# Patient Record
Sex: Female | Born: 1980 | Race: White | Hispanic: No | Marital: Single | Smoking: Never smoker
Health system: Southern US, Community
[De-identification: ages and names within clinical notes are randomized; demographics above are authoritative.]

## PROBLEM LIST (undated history)

## (undated) DIAGNOSIS — N200 Calculus of kidney: Secondary | ICD-10-CM

## (undated) DIAGNOSIS — E669 Obesity, unspecified: Secondary | ICD-10-CM

---

## 2016-04-23 ENCOUNTER — Encounter (HOSPITAL_COMMUNITY): Payer: Self-pay | Admitting: *Deleted

## 2016-04-23 ENCOUNTER — Emergency Department (HOSPITAL_COMMUNITY)
Admission: EM | Admit: 2016-04-23 | Discharge: 2016-04-23 | Disposition: A | Discharge status: Home / Self Care | Payer: BLUE CROSS/BLUE SHIELD | Attending: Emergency Medicine | Admitting: Emergency Medicine

## 2016-04-23 DIAGNOSIS — N23 Unspecified renal colic: Secondary | ICD-10-CM | POA: Diagnosis not present

## 2016-04-23 DIAGNOSIS — R109 Unspecified abdominal pain: Secondary | ICD-10-CM | POA: Diagnosis present

## 2016-04-23 HISTORY — DX: Calculus of kidney: N20.0

## 2016-04-23 HISTORY — DX: Obesity, unspecified: E66.9

## 2016-04-23 LAB — CBC
HCT: 41.8 % (ref 36.0–46.0)
Hemoglobin: 12.9 g/dL (ref 12.0–15.0)
MCH: 25.4 pg — ABNORMAL LOW (ref 26.0–34.0)
MCHC: 30.9 g/dL (ref 30.0–36.0)
MCV: 82.4 fL (ref 78.0–100.0)
PLATELETS: 335 10*3/uL (ref 150–400)
RBC: 5.07 MIL/uL (ref 3.87–5.11)
RDW: 15.1 % (ref 11.5–15.5)
WBC: 7.8 10*3/uL (ref 4.0–10.5)

## 2016-04-23 LAB — BASIC METABOLIC PANEL
Anion gap: 9 (ref 5–15)
BUN: 8 mg/dL (ref 6–20)
CALCIUM: 9.1 mg/dL (ref 8.9–10.3)
CO2: 17 mmol/L — ABNORMAL LOW (ref 22–32)
CREATININE: 0.57 mg/dL (ref 0.44–1.00)
Chloride: 110 mmol/L (ref 101–111)
Glucose, Bld: 94 mg/dL (ref 65–99)
Potassium: 4.2 mmol/L (ref 3.5–5.1)
SODIUM: 136 mmol/L (ref 135–145)

## 2016-04-23 LAB — URINE MICROSCOPIC-ADD ON

## 2016-04-23 LAB — URINALYSIS, ROUTINE W REFLEX MICROSCOPIC
Bilirubin Urine: NEGATIVE
GLUCOSE, UA: NEGATIVE mg/dL
Ketones, ur: NEGATIVE mg/dL
LEUKOCYTES UA: NEGATIVE
NITRITE: NEGATIVE
PROTEIN: NEGATIVE mg/dL
Specific Gravity, Urine: 1.02 (ref 1.005–1.030)
pH: 7.5 (ref 5.0–8.0)

## 2016-04-23 LAB — I-STAT BETA HCG BLOOD, ED (MC, WL, AP ONLY)

## 2016-04-23 MED ORDER — ONDANSETRON 4 MG PO TBDP
ORAL_TABLET | ORAL | Status: AC
  Filled 2016-04-23: qty 1

## 2016-04-23 MED ORDER — SULFAMETHOXAZOLE-TRIMETHOPRIM 800-160 MG PO TABS: 1.0000 | ORAL_TABLET | Freq: Every day | ORAL | 0 refills | Status: AC

## 2016-04-23 MED ORDER — ONDANSETRON 4 MG PO TBDP: 4.0000 mg | ORAL_TABLET | Freq: Three times a day (TID) | ORAL | 0 refills | Status: DC | PRN

## 2016-04-23 MED ORDER — HYDROMORPHONE HCL 1 MG/ML IJ SOLN
1.0000 mg | INTRAMUSCULAR | Status: DC | PRN
  Administered 2016-04-23: 1 mg via INTRAVENOUS
  Filled 2016-04-23: qty 1

## 2016-04-23 MED ORDER — ONDANSETRON HCL 4 MG/2ML IJ SOLN
4.0000 mg | Freq: Once | INTRAMUSCULAR | Status: AC
  Administered 2016-04-23: 4 mg via INTRAVENOUS
  Filled 2016-04-23: qty 2

## 2016-04-23 MED ORDER — OXYCODONE-ACETAMINOPHEN 5-325 MG PO TABS
ORAL_TABLET | ORAL | Status: AC
  Filled 2016-04-23: qty 1

## 2016-04-23 MED ORDER — ONDANSETRON 4 MG PO TBDP
4.0000 mg | ORAL_TABLET | Freq: Once | ORAL | Status: AC | PRN
  Administered 2016-04-23: 4 mg via ORAL

## 2016-04-23 MED ORDER — HYDROCODONE-ACETAMINOPHEN 5-325 MG PO TABS
1.0000 | ORAL_TABLET | Freq: Once | ORAL | Status: AC
  Administered 2016-04-23: 1 via ORAL
  Filled 2016-04-23: qty 1

## 2016-04-23 MED ORDER — SODIUM CHLORIDE 0.9 % IV BOLUS (SEPSIS)
1000.0000 mL | Freq: Once | INTRAVENOUS | Status: AC
  Administered 2016-04-23: 1000 mL via INTRAVENOUS

## 2016-04-23 MED ORDER — OXYCODONE-ACETAMINOPHEN 5-325 MG PO TABS
1.0000 | ORAL_TABLET | Freq: Once | ORAL | Status: AC
  Administered 2016-04-23: 1 via ORAL

## 2016-04-23 MED ORDER — TAMSULOSIN HCL 0.4 MG PO CAPS: 0.4000 mg | ORAL_CAPSULE | Freq: Every day | ORAL | 0 refills | Status: DC

## 2016-04-23 MED ORDER — HYDROCODONE-ACETAMINOPHEN 10-325 MG PO TABS: 1.0000 | ORAL_TABLET | Freq: Four times a day (QID) | ORAL | 0 refills | Status: DC | PRN

## 2016-04-23 MED ORDER — KETOROLAC TROMETHAMINE 30 MG/ML IJ SOLN
30.0000 mg | Freq: Once | INTRAMUSCULAR | Status: AC
  Administered 2016-04-23: 30 mg via INTRAVENOUS
  Filled 2016-04-23: qty 1

## 2016-04-23 MED ORDER — SULFAMETHOXAZOLE-TRIMETHOPRIM 800-160 MG PO TABS
1.0000 | ORAL_TABLET | Freq: Once | ORAL | Status: AC
  Administered 2016-04-23: 1 via ORAL
  Filled 2016-04-23: qty 1

## 2016-04-23 MED ORDER — TAMSULOSIN HCL 0.4 MG PO CAPS
0.4000 mg | ORAL_CAPSULE | Freq: Once | ORAL | Status: AC
  Administered 2016-04-23: 0.4 mg via ORAL
  Filled 2016-04-23: qty 1

## 2016-04-23 NOTE — Discharge Instructions (Signed)
Call your urologist to discuss your most recent CT scan.  Recheck with fever or vomiting or uncontrolled pain.

## 2016-04-23 NOTE — ED Notes (Signed)
Dr. James at bedside  

## 2016-04-23 NOTE — ED Provider Notes (Signed)
[facility] Provider Note   CSN: [id number] Arrival date & time: 04/23/16  1415  First Provider Contact:  None       History   Chief Complaint Chief Complaint  Patient presents with  . Flank Pain    HPI Kayla Cabrera is a 35 y.o. female.  She has a history of kidney stones. She follows with a urologist in [place]. She has never had to have a stone extracted. She is traveling to [place]. She is with friends. She had sudden onset severe left flank pain reminiscent of her previous kidney stones. She presents here for evaluation. No preceding dysuria no fever.  HPI  Past Medical History:  Diagnosis Date  . Kidney stones   . Obesity     There are no active problems to display for this patient.   History reviewed. No pertinent surgical history.  OB History    No data available       Home Medications    Prior to Admission medications   Medication Sig Start Date End Date Taking? Authorizing Provider  HYDROcodone-acetaminophen (NORCO) 10-325 MG tablet Take 1 tablet by mouth every 6 (six) hours as needed. 04/23/16   Rolland Porter, MD  ondansetron (ZOFRAN ODT) 4 MG disintegrating tablet Take 1 tablet (4 mg total) by mouth every 8 (eight) hours as needed for nausea. 04/23/16   Rolland Porter, MD  sulfamethoxazole-trimethoprim (BACTRIM DS,SEPTRA DS) 800-160 MG tablet Take 1 tablet by mouth daily. 04/23/16 05/07/16  Rolland Porter, MD  tamsulosin (FLOMAX) 0.4 MG CAPS capsule Take 1 capsule (0.4 mg total) by mouth daily. 04/23/16   Rolland Porter, MD    Family History History reviewed. No pertinent family history.  Social History Social History  Substance Use Topics  . Smoking status: Never Smoker  . Smokeless tobacco: Not on file  . Alcohol use Yes     Comment: occ     Allergies   Review of patient's allergies indicates no known allergies.   Review of Systems Review of Systems  Constitutional: Negative for appetite change, chills, diaphoresis, fatigue and fever.  HENT:  Negative for mouth sores, sore throat and trouble swallowing.   Eyes: Negative for visual disturbance.  Respiratory: Negative for cough, chest tightness, shortness of breath and wheezing.   Cardiovascular: Negative for chest pain.  Gastrointestinal: Negative for abdominal distention, abdominal pain, diarrhea, nausea and vomiting.  Endocrine: Negative for polydipsia, polyphagia and polyuria.  Genitourinary: Positive for dysuria and urgency. Negative for frequency and hematuria.  Musculoskeletal: Negative for gait problem.  Skin: Negative for color change, pallor and rash.  Neurological: Negative for dizziness, syncope, light-headedness and headaches.  Hematological: Does not bruise/bleed easily.  Psychiatric/Behavioral: Negative for behavioral problems and confusion.     Physical Exam Updated Vital Signs BP 140/82   Pulse 96   Temp 98.1 F (36.7 C) (Oral)   Resp 16   LMP 04/09/2016 (Approximate)   SpO2 100%   Physical Exam  Constitutional: She is oriented to person, place, and time. She appears well-developed and well-nourished. No distress.  Awake and alert. Pacing. Lower hand against her flank. She appears uncomfortable.  HENT:  Head: Normocephalic.  Eyes: Conjunctivae are normal. Pupils are equal, round, and reactive to light. No scleral icterus.  Neck: Normal range of motion. Neck supple. No thyromegaly present.  Cardiovascular: Normal rate and regular rhythm.  Exam reveals no gallop and no friction rub.   No murmur heard. Pulmonary/Chest: Effort normal and breath sounds normal. No respiratory distress. She has  no wheezes. She has no rales.  Abdominal: Soft. Bowel sounds are normal. She exhibits no distension. There is no tenderness. There is no rebound.  No reproducible tenderness in the abdomen or flank.  Musculoskeletal: Normal range of motion.  Neurological: She is alert and oriented to person, place, and time.  Skin: Skin is warm and dry. No rash noted.  Psychiatric:  She has a normal mood and affect. Her behavior is normal.     ED Treatments / Results  Labs (all labs ordered are listed, but only abnormal results are displayed) Labs Reviewed  URINALYSIS, ROUTINE W REFLEX MICROSCOPIC (NOT AT [facility]) - Abnormal; Notable for the following:       Result Value   APPearance CLOUDY (*)    Hgb urine dipstick LARGE (*)    All other components within normal limits  BASIC METABOLIC PANEL - Abnormal; Notable for the following:    CO2 17 (*)    All other components within normal limits  CBC - Abnormal; Notable for the following:    MCH 25.4 (*)    All other components within normal limits  URINE MICROSCOPIC-ADD ON - Abnormal; Notable for the following:    Squamous Epithelial / LPF 0-5 (*)    Bacteria, UA MANY (*)    All other components within normal limits  URINE CULTURE  I-STAT BETA HCG BLOOD, ED ([facility], [facility], [facility] ONLY)    EKG  EKG Interpretation None       Radiology No results found.  Procedures Procedures (including critical care time)  Medications Ordered in ED Medications  ondansetron (ZOFRAN-ODT) 4 MG disintegrating tablet (not administered)  oxyCODONE-acetaminophen (PERCOCET/ROXICET) 5-325 MG per tablet (not administered)  HYDROmorphone (DILAUDID) injection 1 mg (1 mg Intravenous Given 04/23/16 1840)  sulfamethoxazole-trimethoprim (BACTRIM DS,SEPTRA DS) 800-160 MG per tablet 1 tablet (not administered)  ondansetron (ZOFRAN-ODT) disintegrating tablet 4 mg (4 mg Oral Given 04/23/16 1503)  oxyCODONE-acetaminophen (PERCOCET/ROXICET) 5-325 MG per tablet 1 tablet (1 tablet Oral Given 04/23/16 1504)  ondansetron (ZOFRAN) injection 4 mg (4 mg Intravenous Given 04/23/16 1839)  ketorolac (TORADOL) 30 MG/ML injection 30 mg (30 mg Intravenous Given 04/23/16 1839)  tamsulosin (FLOMAX) capsule 0.4 mg (0.4 mg Oral Given 04/23/16 1847)  sodium chloride 0.9 % bolus 1,000 mL (0 mLs Intravenous Stopped 04/23/16 2059)  ondansetron (ZOFRAN) injection 4 mg (4 mg Intravenous  Given 04/23/16 2057)  HYDROcodone-acetaminophen (NORCO/VICODIN) 5-325 MG per tablet 1 tablet (1 tablet Oral Given 04/23/16 2059)     Initial Impression / Assessment and Plan / ED Course  I have reviewed the triage vital signs and the nursing notes.  Pertinent labs & imaging results that were available during my care of the patient were reviewed by me and considered in my medical decision making (see chart for details).  Clinical Course    Patient's pain was much improved and appeared relaxed and more comfortable on exam.  She has too many to count red blood cells. 0-5 wbc's. Nitrate negative but many bacteria. Culture pending. I think this is very likely ureteral colic and not as coexistent UTI. Her symptoms were sudden in onset without preceding dysuria. She follows with a urologist in [place]. She had an imaging study about 6 weeks ago. She has never had have a stone extracted in the past. Have asked her to call her urologist to discuss her most recent CT scan. If previous stones are small enough size to expect spontaneous passage I do not think this would preclude her travel. However I  will also prescribed her Bactrim to take a dose prophylactically daily. Urine culture is pending as well. She will return to emergency room with fever, vomiting, or uncontrolled pain.  Final Clinical Impressions(s) / ED Diagnoses   Final diagnoses:  Ureteral colic    New Prescriptions New Prescriptions   HYDROCODONE-ACETAMINOPHEN (NORCO) 10-325 MG TABLET    Take 1 tablet by mouth every 6 (six) hours as needed.   ONDANSETRON (ZOFRAN ODT) 4 MG DISINTEGRATING TABLET    Take 1 tablet (4 mg total) by mouth every 8 (eight) hours as needed for nausea.   SULFAMETHOXAZOLE-TRIMETHOPRIM (BACTRIM DS,SEPTRA DS) 800-160 MG TABLET    Take 1 tablet by mouth daily.   TAMSULOSIN (FLOMAX) 0.4 MG CAPS CAPSULE    Take 1 capsule (0.4 mg total) by mouth daily.     Rolland Porter, MD 04/23/16 2108

## 2016-04-23 NOTE — ED Triage Notes (Signed)
Pt reports onset two hours ago of left side flank pain that radiates around her side. Having nausea and dark colored urine. Hx of kidney stones.

## 2016-04-25 LAB — URINE CULTURE

## 2016-08-20 ENCOUNTER — Encounter (HOSPITAL_COMMUNITY): Payer: Self-pay | Admitting: *Deleted

## 2016-08-20 DIAGNOSIS — R319 Hematuria, unspecified: Secondary | ICD-10-CM | POA: Insufficient documentation

## 2016-08-20 DIAGNOSIS — R109 Unspecified abdominal pain: Secondary | ICD-10-CM | POA: Diagnosis present

## 2016-08-20 LAB — URINALYSIS, ROUTINE W REFLEX MICROSCOPIC
BILIRUBIN URINE: NEGATIVE
GLUCOSE, UA: NEGATIVE mg/dL
KETONES UR: NEGATIVE mg/dL
Leukocytes, UA: NEGATIVE
NITRITE: NEGATIVE
PH: 6 (ref 5.0–8.0)
Protein, ur: NEGATIVE mg/dL
Specific Gravity, Urine: 1.024 (ref 1.005–1.030)

## 2016-08-20 LAB — CBC
HCT: 41.6 % (ref 36.0–46.0)
Hemoglobin: 13.5 g/dL (ref 12.0–15.0)
MCH: 26.2 pg (ref 26.0–34.0)
MCHC: 32.5 g/dL (ref 30.0–36.0)
MCV: 80.8 fL (ref 78.0–100.0)
PLATELETS: 331 10*3/uL (ref 150–400)
RBC: 5.15 MIL/uL — ABNORMAL HIGH (ref 3.87–5.11)
RDW: 14.5 % (ref 11.5–15.5)
WBC: 9.5 10*3/uL (ref 4.0–10.5)

## 2016-08-20 LAB — BASIC METABOLIC PANEL
Anion gap: 7 (ref 5–15)
BUN: 9 mg/dL (ref 6–20)
CALCIUM: 9.7 mg/dL (ref 8.9–10.3)
CO2: 24 mmol/L (ref 22–32)
CREATININE: 0.76 mg/dL (ref 0.44–1.00)
Chloride: 107 mmol/L (ref 101–111)
GFR calc non Af Amer: >60 mL/min (ref >60)
GLUCOSE: 85 mg/dL (ref 65–99)
Potassium: 4 mmol/L (ref 3.5–5.1)
Sodium: 138 mmol/L (ref 135–145)

## 2016-08-20 LAB — URINE MICROSCOPIC-ADD ON

## 2016-08-20 MED ORDER — OXYCODONE-ACETAMINOPHEN 5-325 MG PO TABS
1.0000 | ORAL_TABLET | ORAL | Status: DC | PRN
Start: 2016-08-20 — End: 2016-08-21
  Administered 2016-08-20: 1 via ORAL

## 2016-08-20 MED ORDER — OXYCODONE-ACETAMINOPHEN 5-325 MG PO TABS
ORAL_TABLET | ORAL | Status: AC
  Filled 2016-08-20: qty 1

## 2016-08-20 NOTE — ED Triage Notes (Signed)
Pt c/o left sided flank pain onset today. Hx of kidney stones, pain feels the same

## 2016-08-21 ENCOUNTER — Emergency Department (HOSPITAL_COMMUNITY)
Admission: EM | Admit: 2016-08-21 | Discharge: 2016-08-21 | Disposition: A | Discharge status: Home / Self Care | Payer: BLUE CROSS/BLUE SHIELD | Attending: Emergency Medicine | Admitting: Emergency Medicine

## 2016-08-21 DIAGNOSIS — R109 Unspecified abdominal pain: Secondary | ICD-10-CM

## 2016-08-21 DIAGNOSIS — R319 Hematuria, unspecified: Secondary | ICD-10-CM

## 2016-08-21 HISTORY — DX: Calculus of kidney: N20.0

## 2016-08-21 MED ORDER — OXYCODONE-ACETAMINOPHEN 5-325 MG PO TABS: 1.0000 | ORAL_TABLET | Freq: Four times a day (QID) | ORAL | 0 refills | Status: DC | PRN

## 2016-08-21 MED ORDER — KETOROLAC TROMETHAMINE 30 MG/ML IJ SOLN
30.0000 mg | Freq: Once | INTRAMUSCULAR | Status: AC
  Administered 2016-08-21: 30 mg via INTRAVENOUS
  Filled 2016-08-21: qty 1

## 2016-08-21 MED ORDER — SODIUM CHLORIDE 0.9 % IV BOLUS (SEPSIS)
1000.0000 mL | Freq: Once | INTRAVENOUS | Status: AC
  Administered 2016-08-21: 1000 mL via INTRAVENOUS

## 2016-08-21 MED ORDER — ONDANSETRON HCL 4 MG/2ML IJ SOLN
8.0000 mg | Freq: Once | INTRAMUSCULAR | Status: AC
  Administered 2016-08-21: 8 mg via INTRAVENOUS
  Filled 2016-08-21: qty 4

## 2016-08-21 MED ORDER — TAMSULOSIN HCL 0.4 MG PO CAPS: 0.4000 mg | ORAL_CAPSULE | Freq: Every day | ORAL | 0 refills | Status: DC

## 2016-08-21 MED ORDER — ONDANSETRON 4 MG PO TBDP: 4.0000 mg | ORAL_TABLET | Freq: Four times a day (QID) | ORAL | 0 refills | Status: DC | PRN

## 2016-08-21 MED ORDER — HYDROMORPHONE HCL 2 MG/ML IJ SOLN
1.0000 mg | Freq: Once | INTRAMUSCULAR | Status: AC
  Administered 2016-08-21: 1 mg via INTRAVENOUS
  Filled 2016-08-21: qty 1

## 2016-08-21 MED ORDER — IBUPROFEN 800 MG PO TABS: 800.0000 mg | ORAL_TABLET | Freq: Three times a day (TID) | ORAL | 0 refills | Status: DC | PRN

## 2016-08-21 NOTE — ED Provider Notes (Signed)
TIME SEEN:  By signing my name below, I, Arianna Nassar, attest that this documentation has been prepared under the direction and in the presence of [employer] EnergyKristen N Kyeshia Zinn, DO.  Electronically Signed: Octavia HeirArianna Nassar, ED Scribe. 08/21/16. 1:52 AM.   CHIEF COMPLAINT:  Chief Complaint  Patient presents with  . Nephrolithiasis     HPI:  HPI Comments: Kayla Cabrera is a [age] y.o. female who has a hx of kidney stones presents to the Emergency Department complaining of sudden onset, waxing and waning, gradual worsening, left Flank pain that started ~ 3 pm this afternoon. Pt describes the pain as sharp. She reports associated mild abdominal bloating, nausea and vomiting. Pt believes she may have a kidney stone due to having similar symptoms previous hx of multiple left sided kidney stones. Pt has always passed her kidney stones without any intervention. She denies abdominal pain, fever or diarrhea.  ROS: See HPI Constitutional: no fever  Eyes: no drainage  ENT: no runny nose   Cardiovascular:  no chest pain  Resp: no SOB  GI: no vomiting GU: no dysuria Integumentary: no rash  Allergy: no hives  Musculoskeletal: no leg swelling  Neurological: no slurred speech ROS otherwise negative  PAST MEDICAL HISTORY/PAST SURGICAL HISTORY:  Past Medical History:  Diagnosis Date  . Kidney stone   . Kidney stones   . Obesity     MEDICATIONS:  Prior to Admission medications   Medication Sig Start Date End Date Taking? Authorizing Provider  HYDROcodone-acetaminophen (NORCO) 10-325 MG tablet Take 1 tablet by mouth every 6 (six) hours as needed. 04/23/16   Rolland PorterMark James, MD  ondansetron (ZOFRAN ODT) 4 MG disintegrating tablet Take 1 tablet (4 mg total) by mouth every 8 (eight) hours as needed for nausea. 04/23/16   Rolland PorterMark James, MD  tamsulosin (FLOMAX) 0.4 MG CAPS capsule Take 1 capsule (0.4 mg total) by mouth daily. 04/23/16   Rolland PorterMark James, MD    ALLERGIES:  No Known Allergies  SOCIAL HISTORY:  Social History   Substance Use Topics  . Smoking status: Never Smoker  . Smokeless tobacco: Never Used  . Alcohol use Yes     Comment: occ    FAMILY HISTORY: No family history on file.  EXAM: BP 143/85 (BP Location: Left Arm)   Pulse 94   Temp 98 F (36.7 C) (Oral)   Resp 24   LMP 08/03/2016   SpO2 100%  CONSTITUTIONAL: Alert and oriented and responds appropriately to questions. Well-appearing; well-nourished; obese; appears uncomfortable; afebrile, nontoxic appearing. HEAD: Normocephalic EYES: Conjunctivae clear, PERRL, EOMI ENT: normal nose; no rhinorrhea; moist mucous membranes NECK: Supple, no meningismus, no nuchal rigidity, no LAD  CARD: RRR; S1 and S2 appreciated; no murmurs, no clicks, no rubs, no gallops RESP: Normal chest excursion without splinting or tachypnea; breath sounds clear and equal bilaterally; no wheezes, no rhonchi, no rales, no hypoxia or respiratory distress, speaking full sentences ABD/GI: Normal bowel sounds; non-distended; soft, non-tender, no rebound, no guarding, no peritoneal signs, no hepatosplenomegaly BACK:  The back appears normal and is non-tender to palpation, there is no CVA tenderness EXT: Normal ROM in all joints; non-tender to palpation; no edema; normal capillary refill; no cyanosis, no calf tenderness or swelling    SKIN: Normal color for age and race; warm; no rash NEURO: Moves all extremities equally, sensation to light touch intact diffusely, cranial nerves II through XII intact, normal speech PSYCH: The patient's mood and manner are appropriate. Grooming and personal hygiene are appropriate.  MEDICAL DECISION  MAKING: Patient here with acute onset left flank pain. Feels similar to her prior kidney stones. Labs ordered in triage are unremarkable. Urine shows blood but no other sign of infection. I do not feel she needs imaging at this time given confirmed history of kidney stones. Will treat with Toradol, Dilaudid, IV fluids, Zofran and reassess.  2:50  AM Pt states that she feels much better and feels like a "new person". Pt is comfortable with above plan and is stable for discharge at this time. Strict return precautions for return into the ED were discussed.    At this time, I do not feel there is any life-threatening condition present. I have reviewed and discussed all results (EKG, imaging, lab, urine as appropriate) and exam findings with patient/family. I have reviewed nursing notes and appropriate previous records.  I feel the patient is safe to be discharged home without further emergent workup and can continue workup as an outpatient as needed. Discussed usual and customary return precautions. Patient/family verbalize understanding and are comfortable with this plan.  Outpatient follow-up has been provided. All questions have been answered.   I personally performed the services described in this documentation, which was scribed in my presence. The recorded information has been reviewed and is accurate.    Layla MawKristen N Mardell Cragg, DO 08/21/16 [phone]

## 2016-09-21 ENCOUNTER — Emergency Department (HOSPITAL_COMMUNITY)
Admission: EM | Admit: 2016-09-21 | Discharge: 2016-09-21 | Disposition: A | Discharge status: Home / Self Care | Payer: BLUE CROSS/BLUE SHIELD | Attending: Emergency Medicine | Admitting: Emergency Medicine

## 2016-09-21 ENCOUNTER — Encounter (HOSPITAL_COMMUNITY): Payer: Self-pay | Admitting: *Deleted

## 2016-09-21 ENCOUNTER — Emergency Department (HOSPITAL_COMMUNITY): Payer: BLUE CROSS/BLUE SHIELD

## 2016-09-21 DIAGNOSIS — R109 Unspecified abdominal pain: Secondary | ICD-10-CM | POA: Insufficient documentation

## 2016-09-21 DIAGNOSIS — Z7289 Other problems related to lifestyle: Secondary | ICD-10-CM | POA: Diagnosis not present

## 2016-09-21 DIAGNOSIS — R000 Tachycardia, unspecified: Secondary | ICD-10-CM | POA: Insufficient documentation

## 2016-09-21 DIAGNOSIS — Z765 Malingerer [conscious simulation]: Secondary | ICD-10-CM

## 2016-09-21 LAB — CBC
HCT: 40.3 % (ref 36.0–46.0)
Hemoglobin: 13 g/dL (ref 12.0–15.0)
MCH: 26 pg (ref 26.0–34.0)
MCHC: 32.3 g/dL (ref 30.0–36.0)
MCV: 80.6 fL (ref 78.0–100.0)
Platelets: 307 10*3/uL (ref 150–400)
RBC: 5 MIL/uL (ref 3.87–5.11)
RDW: 14.6 % (ref 11.5–15.5)
WBC: 11.5 10*3/uL — ABNORMAL HIGH (ref 4.0–10.5)

## 2016-09-21 LAB — COMPREHENSIVE METABOLIC PANEL
ALT: 16 U/L (ref 14–54)
ANION GAP: 10 (ref 5–15)
AST: 19 U/L (ref 15–41)
Albumin: 3.7 g/dL (ref 3.5–5.0)
Alkaline Phosphatase: 62 U/L (ref 38–126)
BUN: 7 mg/dL (ref 6–20)
CALCIUM: 9.2 mg/dL (ref 8.9–10.3)
CO2: 21 mmol/L — ABNORMAL LOW (ref 22–32)
Chloride: 106 mmol/L (ref 101–111)
Creatinine, Ser: 0.85 mg/dL (ref 0.44–1.00)
GFR calc Af Amer: >60 mL/min (ref >60)
GLUCOSE: 84 mg/dL (ref 65–99)
Potassium: 3.6 mmol/L (ref 3.5–5.1)
SODIUM: 137 mmol/L (ref 135–145)
TOTAL PROTEIN: 6.9 g/dL (ref 6.5–8.1)
Total Bilirubin: 0.4 mg/dL (ref 0.3–1.2)

## 2016-09-21 LAB — URINALYSIS, ROUTINE W REFLEX MICROSCOPIC
Bilirubin Urine: NEGATIVE
GLUCOSE, UA: NEGATIVE mg/dL
Ketones, ur: NEGATIVE mg/dL
Leukocytes, UA: NEGATIVE
NITRITE: NEGATIVE
PH: 5 (ref 5.0–8.0)
Protein, ur: NEGATIVE mg/dL
SPECIFIC GRAVITY, URINE: 1.006 (ref 1.005–1.030)

## 2016-09-21 LAB — I-STAT BETA HCG BLOOD, ED (MC, WL, AP ONLY): I-stat hCG, quantitative: <5 m[IU]/mL (ref <5)

## 2016-09-21 LAB — LIPASE, BLOOD: Lipase: 26 U/L (ref 11–51)

## 2016-09-21 NOTE — ED Triage Notes (Addendum)
Hx of kidney stones, pt having left side flank pain that radiates around to front. reports having n/v/d and fever. Appears uncomfortable at triage.

## 2016-09-21 NOTE — ED Provider Notes (Signed)
[facility] Provider Note   CSN: [id number] Arrival date & time: 09/21/16  1642     History   Chief Complaint Chief Complaint  Patient presents with  . Back Pain  . Emesis  . Fever    HPI Kayla Cabrera is a 35 y.o. female.  HPI Patient presents with left flank pain for last 2 hours. States that it started suddenly. States feels a previous kidney stones. States her urine spell little bit dark and she is worried that she has another stone. Denies fevers or me. Has had nausea and vomiting. She states she came here directly after the pain started. Reviewing records shows that she was at another hospital earlier today in [place]. Patient's denies this and states she was at a different hospital 3 days ago. She also told me she did not had any kidney stones for the last month but has had 3 ER visits for pain in the last month. [facility] she was seen and sent home with suspicion of drug-seeking behaviors and she had a negative renal ultrasound and multiple prior prescriptions. She had prescriptions filled from [facility] 3 days ago. She told me she had not had any pain within the last month. Patient states that she only took a Toradol for the pain. States she does not have any hydrocodone but she did have 12 from filled 3 days ago.Patient states she lives out of town but is moving up here.   Past Medical History:  Diagnosis Date  . Kidney stone   . Kidney stones   . Obesity     There are no active problems to display for this patient.   History reviewed. No pertinent surgical history.  OB History    No data available       Home Medications    Prior to Admission medications   Medication Sig Start Date End Date Taking? Authorizing Provider  ibuprofen (ADVIL,MOTRIN) 800 MG tablet Take 1 tablet (800 mg total) by mouth every 8 (eight) hours as needed for mild pain. 08/21/16   Kristen N Ward, DO  ondansetron (ZOFRAN ODT) 4 MG disintegrating tablet Take 1  tablet (4 mg total) by mouth every 6 (six) hours as needed for nausea or vomiting. 08/21/16   Kristen N Ward, DO  oxyCODONE-acetaminophen (PERCOCET/ROXICET) 5-325 MG tablet Take 1-2 tablets by mouth every 6 (six) hours as needed. 08/21/16   Kristen N Ward, DO  tamsulosin (FLOMAX) 0.4 MG CAPS capsule Take 1 capsule (0.4 mg total) by mouth daily. 08/21/16   Layla MawKristen N Ward, DO    Family History History reviewed. No pertinent family history.  Social History Social History  Substance Use Topics  . Smoking status: Never Smoker  . Smokeless tobacco: Never Used  . Alcohol use Yes     Comment: occ     Allergies   Patient has no known allergies.   Review of Systems Review of Systems  Constitutional: Negative for appetite change and fever.  Respiratory: Negative for cough.   Gastrointestinal: Positive for nausea and vomiting. Negative for abdominal pain.  Genitourinary: Positive for flank pain. Negative for hematuria and pelvic pain.  Musculoskeletal: Negative for back pain.  Neurological: Negative for headaches.     Physical Exam Updated Vital Signs BP (!) 129/104 (BP Location: Right Arm)   Pulse 120   Temp 98.3 F (36.8 C) (Oral)   Resp 22   LMP 09/10/2016   SpO2 100%   Physical Exam  Constitutional: She appears well-developed.  HENT:  Head: Normocephalic.  Eyes: Pupils are equal, round, and reactive to light.  Cardiovascular:  Mild tachycardia  Pulmonary/Chest: Effort normal.  Abdominal: Soft. She exhibits no distension.  Musculoskeletal:  No CVA tenderness. Patient is standing and over onto the bed.  Skin: Skin is warm.     ED Treatments / Results  Labs (all labs ordered are listed, but only abnormal results are displayed) Labs Reviewed  URINALYSIS, ROUTINE W REFLEX MICROSCOPIC - Abnormal; Notable for the following:       Result Value   Hgb urine dipstick LARGE (*)    Bacteria, UA RARE (*)    Squamous Epithelial / LPF 0-5 (*)    All other components within  normal limits  LIPASE, BLOOD  COMPREHENSIVE METABOLIC PANEL  CBC  I-STAT BETA HCG BLOOD, ED ([facility], [facility], [facility] ONLY)    EKG  EKG Interpretation None       Radiology No results found.  Procedures Procedures (including critical care time)  Medications Ordered in ED Medications - No data to display   Initial Impression / Assessment and Plan / ED Course  I have reviewed the triage vital signs and the nursing notes.  Pertinent labs & imaging results that were available during my care of the patient were reviewed by me and considered in my medical decision making (see chart for details).  Clinical Course     Patient presented with reported flank pain like previous kidney stones. Was seen at another ER earlier today which patient denied to me. She eloped from thereafter should not get narcotics. She was informed here she would not get narcotics unless the ultrasound showed hydronephrosis. She eloped from the ER here. High suspicion for drug-seeking behavior. Final Clinical Impressions(s) / ED Diagnoses   Final diagnoses:  Drug-seeking behavior    New Prescriptions New Prescriptions   No medications on file     Benjiman CoreNathan Zalyn Amend, MD 09/21/16 [id number]

## 2016-09-21 NOTE — ED Notes (Signed)
ED Provider at bedside. 

## 2016-12-10 ENCOUNTER — Inpatient Hospital Stay
Admit: 2016-12-10 | Discharge: 2016-12-10 | Disposition: A | Discharge status: Home / Self Care | Payer: BLUE CROSS/BLUE SHIELD | Attending: Emergency Medicine

## 2016-12-10 ENCOUNTER — Emergency Department: Admit: 2016-12-10 | Payer: BLUE CROSS/BLUE SHIELD | Primary: Family Medicine

## 2016-12-10 DIAGNOSIS — R109 Unspecified abdominal pain: Secondary | ICD-10-CM

## 2016-12-10 LAB — METABOLIC PANEL, COMPREHENSIVE
A-G Ratio: 0.9 — ABNORMAL LOW (ref 1.2–3.5)
ALT (SGPT): 33 U/L (ref 12–65)
AST (SGOT): 20 U/L (ref 15–37)
Albumin: 3.9 g/dL (ref 3.5–5.0)
Alk. phosphatase: 92 U/L (ref 50–136)
Anion gap: 12 mmol/L (ref 7–16)
BUN: 7 MG/DL (ref 6–23)
Bilirubin, total: 0.4 MG/DL (ref 0.2–1.1)
CO2: 23 mmol/L (ref 21–32)
Calcium: 9.2 MG/DL (ref 8.3–10.4)
Chloride: 105 mmol/L (ref 98–107)
Creatinine: 0.79 MG/DL (ref 0.6–1.0)
GFR est AA: >60 mL/min/{1.73_m2} (ref >60)
GFR est non-AA: >60 mL/min/{1.73_m2} (ref >60)
Globulin: 4.3 g/dL — ABNORMAL HIGH (ref 2.3–3.5)
Glucose: 87 mg/dL (ref 65–100)
Potassium: 4.3 mmol/L (ref 3.5–5.1)
Protein, total: 8.2 g/dL (ref 6.3–8.2)
Sodium: 140 mmol/L (ref 136–145)

## 2016-12-10 LAB — CBC WITH AUTOMATED DIFF
ABS. BASOPHILS: 0 10*3/uL (ref 0.0–0.2)
ABS. EOSINOPHILS: 0.1 10*3/uL (ref 0.0–0.8)
ABS. IMM. GRANS.: 0 10*3/uL (ref 0.0–0.5)
ABS. LYMPHOCYTES: 2 10*3/uL (ref 0.5–4.6)
ABS. MONOCYTES: 0.7 10*3/uL (ref 0.1–1.3)
ABS. NEUTROPHILS: 6.5 10*3/uL (ref 1.7–8.2)
BASOPHILS: 0 % (ref 0.0–2.0)
EOSINOPHILS: 2 % (ref 0.5–7.8)
HCT: 44.4 % (ref 35.8–46.3)
HGB: 14.5 g/dL (ref 11.7–15.4)
IMMATURE GRANULOCYTES: 0 % (ref 0.0–5.0)
LYMPHOCYTES: 21 % (ref 13–44)
MCH: 27.1 PG (ref 26.1–32.9)
MCHC: 32.7 g/dL (ref 31.4–35.0)
MCV: 83 FL (ref 79.6–97.8)
MONOCYTES: 7 % (ref 4.0–12.0)
MPV: 9.4 FL — ABNORMAL LOW (ref 10.8–14.1)
NEUTROPHILS: 70 % (ref 43–78)
PLATELET: 380 10*3/uL (ref 150–450)
RBC: 5.35 M/uL — ABNORMAL HIGH (ref 4.05–5.25)
RDW: 14.6 % (ref 11.9–14.6)
WBC: 9.4 10*3/uL (ref 4.3–11.1)

## 2016-12-10 LAB — URINE MICROSCOPIC
Bacteria: 0 /hpf
Casts: 0 /lpf
Crystals, urine: 0 /LPF
Mucus: 0 /lpf
RBC: >100 /hpf

## 2016-12-10 LAB — HCG URINE, QL. - POC: Pregnancy test,urine (POC): NEGATIVE

## 2016-12-10 MED ORDER — IBUPROFEN 800 MG TAB
800 mg | ORAL_TABLET | Freq: Three times a day (TID) | ORAL | 0 refills | Status: AC | PRN
Start: 2016-12-10 — End: 2016-12-17

## 2016-12-10 MED ORDER — TAMSULOSIN SR 0.4 MG 24 HR CAP
0.4 mg | ORAL_CAPSULE | Freq: Every day | ORAL | 0 refills | Status: AC
Start: 2016-12-10 — End: 2016-12-25

## 2016-12-10 MED ORDER — SODIUM CHLORIDE 0.9% BOLUS IV
0.9 % | Freq: Once | INTRAVENOUS | Status: AC
Start: 2016-12-10 — End: 2016-12-10
  Administered 2016-12-10: 21:00:00 via INTRAVENOUS

## 2016-12-10 MED ORDER — ONDANSETRON 8 MG TAB, RAPID DISSOLVE
8 mg | ORAL_TABLET | Freq: Three times a day (TID) | ORAL | 1 refills | Status: AC | PRN
Start: 2016-12-10 — End: ?

## 2016-12-10 MED ORDER — HYDROCODONE-ACETAMINOPHEN 5 MG-325 MG TAB
5-325 mg | ORAL_TABLET | Freq: Four times a day (QID) | ORAL | 0 refills | Status: AC | PRN
Start: 2016-12-10 — End: ?

## 2016-12-10 MED ORDER — ONDANSETRON (PF) 4 MG/2 ML INJECTION
4 mg/2 mL | INTRAMUSCULAR | Status: AC
Start: 2016-12-10 — End: 2016-12-10
  Administered 2016-12-10: 21:00:00 via INTRAVENOUS

## 2016-12-10 MED ORDER — HYDROMORPHONE 0.5 MG/ML IN 0.9 % NACL IV SYRINGE
0.5 mg/mL | INTRAVENOUS | Status: AC
Start: 2016-12-10 — End: 2016-12-10
  Administered 2016-12-10: 21:00:00 via INTRAVENOUS

## 2016-12-10 MED FILL — ONDANSETRON (PF) 4 MG/2 ML INJECTION: 4 mg/2 mL | INTRAMUSCULAR | Qty: 2

## 2016-12-10 MED FILL — HYDROMORPHONE 0.5 MG/ML IN 0.9 % NACL IV SYRINGE: 0.5 mg/mL | INTRAVENOUS | Qty: 2

## 2016-12-10 NOTE — ED Notes (Signed)
Pt not at bedside

## 2016-12-10 NOTE — ED Notes (Signed)
I have reviewed discharge instructions with the patient.  The patient verbalized understanding.    Patient left ED via Discharge Method: ambulatory to Home with self    Opportunity for questions and clarification provided.       Patient given 3 scripts.         To continue your aftercare when you leave the hospital, you may receive an automated call from our care team to check in on how you are doing.  This is a free service and part of our promise to provide the best care and service to meet your aftercare needs.??? If you have questions, or wish to unsubscribe from this service please call [phone].  Thank you for Choosing our [facility].

## 2016-12-10 NOTE — ED Triage Notes (Signed)
Patient presents to emergency room with complaints of left flank pain, diarrhea, and hematuria since yesterday.  Patient denies any fever, chills, nausea, or vomiting. Patient states history of kidney stones with similar pain.

## 2016-12-10 NOTE — ED Provider Notes (Signed)
HPI Comments: 36 year old white female with history of kidney stones last one passed 08/2016 presents with left flank pain which began gradually yesterday and severe few hours ago.  Has had some hematuria and frequency.  No vomiting, fever.    Patient is a 36 y.o. female presenting with flank pain. The history is provided by the patient.   Flank Pain    Pertinent negatives include no fever, no headaches, no abdominal pain and no dysuria.        Past Medical History:   Diagnosis Date   ??? Kidney calculi        No past surgical history on file.      No family history on file.    Social History     Social History   ??? Marital status: SINGLE     Spouse name: N/A   ??? Number of children: N/A   ??? Years of education: N/A     Occupational History   ??? Not on file.     Social History Main Topics   ??? Smoking status: Never Smoker   ??? Smokeless tobacco: Never Used   ??? Alcohol use No   ??? Drug use: No   ??? Sexual activity: Not on file     Other Topics Concern   ??? Not on file     Social History Narrative   ??? No narrative on file         ALLERGIES: Review of patient's allergies indicates no known allergies.    Review of Systems   Constitutional: Negative for fever.   HENT: Negative for congestion.    Respiratory: Negative for cough and shortness of breath.    Gastrointestinal: Negative for abdominal pain, nausea and vomiting.   Genitourinary: Positive for flank pain and hematuria. Negative for dysuria.   Musculoskeletal: Negative for back pain and neck pain.   Neurological: Negative for headaches.       Vitals:    12/10/16 1601   BP: (!) 174/106   Pulse: (!) 114   Resp: 18   Temp: 98.2 ??F (36.8 ??C)   SpO2: 99%   Weight: 108.9 kg (240 lb)   Height: 5\' 9"  (1.753 m)            Physical Exam   Constitutional: She is oriented to person, place, and time. She appears well-developed and well-nourished. She appears distressed.   HENT:   Head: Normocephalic and atraumatic.   Mouth/Throat: Oropharynx is clear and moist.    Eyes: Conjunctivae are normal. Pupils are equal, round, and reactive to light.   Neck: Normal range of motion. Neck supple.   Cardiovascular: Normal rate and regular rhythm.    No murmur heard.  Pulmonary/Chest: Effort normal and breath sounds normal.   Abdominal: Soft. She exhibits no distension. There is no tenderness.   Musculoskeletal: Normal range of motion. She exhibits no edema.   Neurological: She is alert and oriented to person, place, and time.   Skin: Skin is warm and dry.   Psychiatric: She has a normal mood and affect.   Nursing note and vitals reviewed.       MDM  Number of Diagnoses or Management Options  Bilateral kidney stones:   Flank pain:   Hematuria, unspecified type:   Diagnosis management comments: Blood work is unremarkable.  Urinalysis has blood but no infection.  Pregnancy is negative.  Patient treated IV fluids, IV Dilaudid and Zofran.  CT scan pending to determine if this is related to kidney  stone. Patient is afebrile and nontoxic.  Anticipate she will be safe for discharge home with outpatient management of kidney stone.       Amount and/or Complexity of Data Reviewed  Clinical lab tests: ordered and reviewed  Tests in the radiology section of CPT??: ordered  Tests in the medicine section of CPT??: ordered and reviewed    Risk of Complications, Morbidity, and/or Mortality  Presenting problems: moderate  Diagnostic procedures: moderate  Management options: moderate          ED Course       Procedures

## 2016-12-10 NOTE — ED Notes (Signed)
Medical decision making note:  Urinalysis reveals greater than 100 red cells per high-power field  CT urogram reveals small bilateral intrarenal stones, but no hydroureter, or hydronephrosis.  She may have passed a small stone spontaneously, or may have a small stone hiding between slices on the scan.  Over the significant pathology seen.  Old discharge home with strainer, urological follow-up, and when necessary medicines for renal colic.    Patient originally seen by Dr. Teofilo PodKaczmarek, who should be completing the history and physical soon.  This concludes the "medical decision making note" part of this emergency department visit note.

## 2016-12-10 NOTE — Progress Notes (Signed)
Awaiting results of pregnancy test. Please call CT at [phone] when ready.

## 2017-11-29 ENCOUNTER — Inpatient Hospital Stay
Admit: 2017-11-29 | Discharge: 2017-11-29 | Disposition: A | Discharge status: Home / Self Care | Payer: BLUE CROSS/BLUE SHIELD | Attending: Emergency Medicine

## 2017-11-29 ENCOUNTER — Emergency Department: Admit: 2017-11-29 | Payer: BLUE CROSS/BLUE SHIELD | Primary: Family Medicine

## 2017-11-29 DIAGNOSIS — R109 Unspecified abdominal pain: Secondary | ICD-10-CM

## 2017-11-29 LAB — CBC WITH AUTOMATED DIFF
ABS. BASOPHILS: 0.1 10*3/uL (ref 0.0–0.2)
ABS. EOSINOPHILS: 0.2 10*3/uL (ref 0.0–0.8)
ABS. IMM. GRANS.: 0.1 10*3/uL (ref 0.0–0.5)
ABS. LYMPHOCYTES: 2.1 10*3/uL (ref 0.5–4.6)
ABS. MONOCYTES: 0.8 10*3/uL (ref 0.1–1.3)
ABS. NEUTROPHILS: 6.3 10*3/uL (ref 1.7–8.2)
ABSOLUTE NRBC: 0 10*3/uL (ref 0.0–0.2)
BASOPHILS: 1 % (ref 0.0–2.0)
EOSINOPHILS: 2 % (ref 0.5–7.8)
HCT: 46.9 % — ABNORMAL HIGH (ref 35.8–46.3)
HGB: 15 g/dL (ref 11.7–15.4)
IMMATURE GRANULOCYTES: 1 % (ref 0.0–5.0)
LYMPHOCYTES: 22 % (ref 13–44)
MCH: 26.3 PG (ref 26.1–32.9)
MCHC: 32 g/dL (ref 31.4–35.0)
MCV: 82.3 FL (ref 79.6–97.8)
MONOCYTES: 8 % (ref 4.0–12.0)
MPV: 9.4 FL (ref 9.4–12.3)
NEUTROPHILS: 66 % (ref 43–78)
PLATELET: 342 10*3/uL (ref 150–450)
RBC: 5.7 M/uL — ABNORMAL HIGH (ref 4.05–5.2)
RDW: 14.3 % (ref 11.9–14.6)
WBC: 9.5 10*3/uL (ref 4.3–11.1)

## 2017-11-29 LAB — DRUG SCREEN, URINE
AMPHETAMINES: NEGATIVE
BARBITURATES: NEGATIVE
BENZODIAZEPINES: NEGATIVE
COCAINE: NEGATIVE
METHADONE: NEGATIVE
OPIATES: NEGATIVE
PCP(PHENCYCLIDINE): NEGATIVE
THC (TH-CANNABINOL): NEGATIVE

## 2017-11-29 LAB — METABOLIC PANEL, COMPREHENSIVE
A-G Ratio: 0.9 — ABNORMAL LOW (ref 1.2–3.5)
ALT (SGPT): 15 U/L (ref 12–65)
AST (SGOT): 10 U/L — ABNORMAL LOW (ref 15–37)
Albumin: 3.5 g/dL (ref 3.5–5.0)
Alk. phosphatase: 84 U/L (ref 50–136)
Anion gap: 10 mmol/L (ref 7–16)
BUN: 9 MG/DL (ref 6–23)
Bilirubin, total: 0.5 MG/DL (ref 0.2–1.1)
CO2: 22 mmol/L (ref 21–32)
Calcium: 8.8 MG/DL (ref 8.3–10.4)
Chloride: 108 mmol/L — ABNORMAL HIGH (ref 98–107)
Creatinine: 0.72 MG/DL (ref 0.6–1.0)
GFR est AA: >60 mL/min/{1.73_m2} (ref >60)
GFR est non-AA: >60 mL/min/{1.73_m2} (ref >60)
Globulin: 3.9 g/dL — ABNORMAL HIGH (ref 2.3–3.5)
Glucose: 88 mg/dL (ref 65–100)
Potassium: 3.7 mmol/L (ref 3.5–5.1)
Protein, total: 7.4 g/dL (ref 6.3–8.2)
Sodium: 140 mmol/L (ref 136–145)

## 2017-11-29 LAB — HCG URINE, QL. - POC: Pregnancy test,urine (POC): NEGATIVE

## 2017-11-29 MED ORDER — NAPROXEN 500 MG TAB
500 mg | ORAL_TABLET | Freq: Two times a day (BID) | ORAL | 0 refills | Status: AC
Start: 2017-11-29 — End: 2017-12-09

## 2017-11-29 MED ORDER — KETOROLAC TROMETHAMINE 30 MG/ML INJECTION
30 mg/mL (1 mL) | INTRAMUSCULAR | Status: AC
Start: 2017-11-29 — End: 2017-11-29
  Administered 2017-11-29: 21:00:00 via INTRAVENOUS

## 2017-11-29 MED FILL — KETOROLAC TROMETHAMINE 30 MG/ML INJECTION: 30 mg/mL (1 mL) | INTRAMUSCULAR | Qty: 1

## 2017-11-29 NOTE — ED Notes (Signed)
I have reviewed discharge instructions with the patient.  The patient verbalized understanding.    Patient left ED via Discharge Method: ambulatory to Home with self.    Opportunity for questions and clarification provided.       Patient given 1 scripts.         To continue your aftercare when you leave the hospital, you may receive an automated call from our care team to check in on how you are doing.  This is a free service and part of our promise to provide the best care and service to meet your aftercare needs.??? If you have questions, or wish to unsubscribe from this service please call [phone].  Thank you for Choosing our [facility].

## 2017-11-29 NOTE — ED Provider Notes (Signed)
She is here doing photography at a wedding.  She normally lives in [place].  He presents with left flank pain and some hematuria.  Denies any trauma.  She has had no fever, chills, or infectious changes.  She has had multiple events like this in the past and old records confirm this.  States that she has kidney stones, but most studies just show intrarenal stones with no obstructing stones.  With this.  She has no fever.  She has no other abnormal bleeding.  He denies any dysuria or frequency.  She is on no blood thinners.      The history is provided by the patient.   Flank Pain    This is a new problem. The current episode started 6 to 12 hours ago. The problem has not changed since onset.The problem occurs constantly. The pain is present in the left side and lower back. The pain is moderate. Pertinent negatives include no chest pain and no fever. She has tried nothing for the symptoms.        Past Medical History:   Diagnosis Date   ??? Kidney calculi        History reviewed. No pertinent surgical history.      History reviewed. No pertinent family history.    Social History     Socioeconomic History   ??? Marital status: SINGLE     Spouse name: Not on file   ??? Number of children: Not on file   ??? Years of education: Not on file   ??? Highest education level: Not on file   Social Needs   ??? Financial resource strain: Not on file   ??? Food insecurity - worry: Not on file   ??? Food insecurity - inability: Not on file   ??? Transportation needs - medical: Not on file   ??? Transportation needs - non-medical: Not on file   Occupational History   ??? Not on file   Tobacco Use   ??? Smoking status: Never Smoker   ??? Smokeless tobacco: Never Used   Substance and Sexual Activity   ??? Alcohol use: No   ??? Drug use: No   ??? Sexual activity: Not on file   Other Topics Concern   ??? Not on file   Social History Narrative   ??? Not on file         ALLERGIES: Patient has no known allergies.    Review of Systems    Constitutional: Negative for chills and fever.   Respiratory: Negative.    Cardiovascular: Negative for chest pain.   Genitourinary: Positive for flank pain and hematuria.   All other systems reviewed and are negative.      Vitals:    11/29/17 1314 11/29/17 1740   BP: (!) 159/96 128/87   Pulse:  96   Resp: 16 18   Temp: 98.3 ??F (36.8 ??C) 98.6 ??F (37 ??C)   SpO2: 100% 99%   Weight: 117.9 kg (260 lb)    Height: 5\' 9"  (1.753 m)             Physical Exam   Constitutional: She appears well-developed and well-nourished. No distress.   Not overtly restless   HENT:   Head: Atraumatic.   Eyes: No scleral icterus.   Neck: Neck supple.   Cardiovascular: Normal rate.   Pulmonary/Chest: Effort normal. No respiratory distress.   Abdominal: Soft. There is no tenderness. There is no rebound.   Musculoskeletal: Normal range of motion.  Neurological: She is alert.   Skin: Skin is warm and dry.   Psychiatric: Thought content normal.   Nursing note and vitals reviewed.       MDM  Number of Diagnoses or Management Options  Hematuria, unspecified type:   Left flank pain:   Diagnosis management comments: With some left flank pain.  She has hematuria with this.  No fever or infectious changes.  Do not feel so this is an upper tract infection.  Does have history of that.  She states of stones, but most prior.  Review of chart showed that stones are intrarenal.  We'll have patient [place] in [place] at this point with no fremitus.  Do not feel as though narcotic-based pain meds are required       Amount and/or Complexity of Data Reviewed  Clinical lab tests: reviewed and ordered  Tests in the radiology section of CPT??: reviewed  Independent visualization of images, tracings, or specimens: yes    Patient Progress  Patient progress: stable         Procedures

## 2017-11-29 NOTE — ED Triage Notes (Signed)
Pt ambulatory to triage without complications. Pt reports hx of kidney stones and presents today with c/o left flank pain radiating to the left groin and dark, blood tinged uirne. Pt reports nausea, but denies vomiting or diarrhea, fevers, or burning upon urination.

## 2017-12-24 ENCOUNTER — Emergency Department (HOSPITAL_COMMUNITY)
Admission: EM | Admit: 2017-12-24 | Discharge: 2017-12-25 | Disposition: A | Discharge status: Home / Self Care | Payer: BLUE CROSS/BLUE SHIELD | Attending: Emergency Medicine | Admitting: Emergency Medicine

## 2017-12-24 ENCOUNTER — Encounter (HOSPITAL_COMMUNITY): Payer: Self-pay

## 2017-12-24 DIAGNOSIS — Y92410 Unspecified street and highway as the place of occurrence of the external cause: Secondary | ICD-10-CM | POA: Diagnosis not present

## 2017-12-24 DIAGNOSIS — Y939 Activity, unspecified: Secondary | ICD-10-CM | POA: Insufficient documentation

## 2017-12-24 DIAGNOSIS — Y998 Other external cause status: Secondary | ICD-10-CM | POA: Insufficient documentation

## 2017-12-24 DIAGNOSIS — M791 Myalgia, unspecified site: Secondary | ICD-10-CM | POA: Diagnosis not present

## 2017-12-24 DIAGNOSIS — R51 Headache: Secondary | ICD-10-CM | POA: Insufficient documentation

## 2017-12-24 DIAGNOSIS — S199XXA Unspecified injury of neck, initial encounter: Secondary | ICD-10-CM | POA: Diagnosis present

## 2017-12-24 DIAGNOSIS — S161XXA Strain of muscle, fascia and tendon at neck level, initial encounter: Secondary | ICD-10-CM | POA: Insufficient documentation
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
## 2017-12-25 ENCOUNTER — Emergency Department (HOSPITAL_COMMUNITY): Payer: BLUE CROSS/BLUE SHIELD

## 2017-12-25 ENCOUNTER — Encounter (HOSPITAL_COMMUNITY): Payer: Self-pay

## 2017-12-25 LAB — POC URINE PREG, ED: PREG TEST UR: NEGATIVE

## 2017-12-25 MED ORDER — METHOCARBAMOL 500 MG PO TABS: 500.0000 mg | ORAL_TABLET | Freq: Two times a day (BID) | ORAL | 0 refills | Status: DC | PRN

## 2017-12-25 MED ORDER — ONDANSETRON 4 MG PO TBDP
4.0000 mg | ORAL_TABLET | Freq: Once | ORAL | Status: AC
  Administered 2017-12-25: 4 mg via ORAL
  Filled 2017-12-25: qty 1

## 2017-12-25 MED ORDER — NAPROXEN 500 MG PO TABS: 500.0000 mg | ORAL_TABLET | Freq: Two times a day (BID) | ORAL | 0 refills | Status: DC | PRN

## 2017-12-25 MED ORDER — ACETAMINOPHEN 500 MG PO TABS
1000.0000 mg | ORAL_TABLET | Freq: Once | ORAL | Status: AC
  Administered 2017-12-25: 1000 mg via ORAL
  Filled 2017-12-25: qty 2

## 2017-12-25 MED ORDER — METHOCARBAMOL 500 MG PO TABS
500.0000 mg | ORAL_TABLET | Freq: Once | ORAL | Status: AC
  Administered 2017-12-25: 500 mg via ORAL
  Filled 2017-12-25: qty 1
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
## 2017-12-25 NOTE — ED Notes (Signed)
Patient made aware urine sample is needed. 

## 2018-08-16 ENCOUNTER — Emergency Department (HOSPITAL_COMMUNITY): Payer: BLUE CROSS/BLUE SHIELD

## 2018-08-16 ENCOUNTER — Emergency Department (HOSPITAL_COMMUNITY)
Admission: EM | Admit: 2018-08-16 | Discharge: 2018-08-17 | Disposition: A | Discharge status: Home / Self Care | Payer: BLUE CROSS/BLUE SHIELD | Attending: Emergency Medicine | Admitting: Emergency Medicine

## 2018-08-16 ENCOUNTER — Encounter (HOSPITAL_COMMUNITY): Payer: Self-pay | Admitting: Emergency Medicine

## 2018-08-16 DIAGNOSIS — R1032 Left lower quadrant pain: Secondary | ICD-10-CM

## 2018-08-16 DIAGNOSIS — R112 Nausea with vomiting, unspecified: Secondary | ICD-10-CM | POA: Diagnosis not present

## 2018-08-16 LAB — URINALYSIS, ROUTINE W REFLEX MICROSCOPIC
BILIRUBIN URINE: NEGATIVE
Glucose, UA: NEGATIVE mg/dL
KETONES UR: NEGATIVE mg/dL
LEUKOCYTES UA: NEGATIVE
NITRITE: NEGATIVE
Protein, ur: NEGATIVE mg/dL
RBC / HPF: >50 RBC/hpf — ABNORMAL HIGH (ref 0–5)
SPECIFIC GRAVITY, URINE: 1.023 (ref 1.005–1.030)
pH: 6 (ref 5.0–8.0)

## 2018-08-16 LAB — CBC
HEMATOCRIT: 43 % (ref 36.0–46.0)
HEMOGLOBIN: 13.7 g/dL (ref 12.0–15.0)
MCH: 27.1 pg (ref 26.0–34.0)
MCHC: 31.9 g/dL (ref 30.0–36.0)
MCV: 85.1 fL (ref 80.0–100.0)
NRBC: 0 % (ref 0.0–0.2)
Platelets: 393 10*3/uL (ref 150–400)
RBC: 5.05 MIL/uL (ref 3.87–5.11)
RDW: 14.2 % (ref 11.5–15.5)
WBC: 8.2 10*3/uL (ref 4.0–10.5)

## 2018-08-16 LAB — COMPREHENSIVE METABOLIC PANEL
ALT: 15 U/L (ref 0–44)
AST: 18 U/L (ref 15–41)
Albumin: 4 g/dL (ref 3.5–5.0)
Alkaline Phosphatase: 70 U/L (ref 38–126)
Anion gap: 9 (ref 5–15)
BUN: 12 mg/dL (ref 6–20)
CHLORIDE: 106 mmol/L (ref 98–111)
CO2: 24 mmol/L (ref 22–32)
CREATININE: 0.95 mg/dL (ref 0.44–1.00)
Calcium: 9.5 mg/dL (ref 8.9–10.3)
GFR calc Af Amer: >60 mL/min (ref >60)
GFR calc non Af Amer: >60 mL/min (ref >60)
Glucose, Bld: 121 mg/dL — ABNORMAL HIGH (ref 70–99)
POTASSIUM: 4.4 mmol/L (ref 3.5–5.1)
SODIUM: 139 mmol/L (ref 135–145)
Total Bilirubin: 0.4 mg/dL (ref 0.3–1.2)
Total Protein: 7.2 g/dL (ref 6.5–8.1)

## 2018-08-16 LAB — I-STAT BETA HCG BLOOD, ED (MC, WL, AP ONLY)

## 2018-08-16 LAB — LIPASE, BLOOD: LIPASE: 42 U/L (ref 11–51)

## 2018-08-16 MED ORDER — ONDANSETRON 4 MG PO TBDP: 4.0000 mg | ORAL_TABLET | Freq: Three times a day (TID) | ORAL | 0 refills | Status: DC | PRN

## 2018-08-16 MED ORDER — MORPHINE SULFATE (PF) 4 MG/ML IV SOLN
4.0000 mg | Freq: Once | INTRAVENOUS | Status: AC
  Administered 2018-08-16: 4 mg via INTRAVENOUS
  Filled 2018-08-16: qty 1

## 2018-08-16 MED ORDER — KETOROLAC TROMETHAMINE 30 MG/ML IJ SOLN
30.0000 mg | Freq: Once | INTRAMUSCULAR | Status: AC
  Administered 2018-08-16: 30 mg via INTRAVENOUS
  Filled 2018-08-16: qty 1

## 2018-08-16 MED ORDER — SODIUM CHLORIDE 0.9 % IV BOLUS
1000.0000 mL | Freq: Once | INTRAVENOUS | Status: AC
Start: 2018-08-16 — End: 2018-08-16
  Administered 2018-08-16: 1000 mL via INTRAVENOUS

## 2018-08-16 MED ORDER — ONDANSETRON HCL 4 MG/2ML IJ SOLN
4.0000 mg | Freq: Once | INTRAMUSCULAR | Status: AC
  Administered 2018-08-16: 4 mg via INTRAVENOUS
  Filled 2018-08-16: qty 2

## 2018-08-16 MED ORDER — IBUPROFEN 800 MG PO TABS: 800.0000 mg | ORAL_TABLET | Freq: Three times a day (TID) | ORAL | 0 refills | Status: DC | PRN

## 2018-08-16 MED ORDER — ONDANSETRON HCL 4 MG/2ML IJ SOLN
4.0000 mg | Freq: Once | INTRAMUSCULAR | Status: AC
  Administered 2018-08-16: 4 mg via INTRAVENOUS

## 2018-08-16 MED ORDER — OXYCODONE-ACETAMINOPHEN 5-325 MG PO TABS: 1.0000 | ORAL_TABLET | Freq: Four times a day (QID) | ORAL | 0 refills | Status: DC | PRN

## 2018-08-16 MED ORDER — ONDANSETRON HCL 4 MG/2ML IJ SOLN
INTRAMUSCULAR | Status: AC
  Filled 2018-08-16: qty 2

## 2018-08-16 NOTE — Discharge Instructions (Signed)

## 2018-08-16 NOTE — ED Provider Notes (Signed)
Emergency Department Provider Note   I have reviewed the triage vital signs and the nursing notes.   HISTORY  Chief Complaint Abdominal Pain   HPI Kayla Cabrera is a 37 y.o. female with PMH of kidney stones presents to the emergency department for evaluation of acute onset, sharp left lower abdominal pain.  Patient was driving down the road when symptoms began suddenly.  She had nausea and vomiting with continued lower abdominal discomfort.  No UTI symptoms.  No vaginal bleeding or discharge.  She did recently finish her menstrual cycle.  She was seen in the emergency department at [facility] earlier this month with similar pain.  States this does not feel like kidney stone discomfort that she has had in the past.  Radiates slightly to the flank but mostly is left of suprapubic.  No modifying factors.   Past Medical History:  Diagnosis Date  . Kidney stone   . Kidney stones   . Obesity     There are no active problems to display for this patient.   History reviewed. No pertinent surgical history.  Allergies Patient has no known allergies.  History reviewed. No pertinent family history.  Social History Social History   Tobacco Use  . Smoking status: Never Smoker  . Smokeless tobacco: Never Used  Substance Use Topics  . Alcohol use: Yes    Comment: occ  . Drug use: No    Review of Systems  Constitutional: No fever/chills Eyes: No visual changes. ENT: No sore throat. Cardiovascular: Denies chest pain. Respiratory: Denies shortness of breath. Gastrointestinal: Positive LLQ abdominal pain. Positive nausea and vomiting.  No diarrhea.  No constipation. Genitourinary: Negative for dysuria. Musculoskeletal: Negative for back pain. Skin: Negative for rash. Neurological: Negative for headaches, focal weakness or numbness.  10-point ROS otherwise negative.  ____________________________________________   PHYSICAL EXAM:  VITAL SIGNS: ED Triage Vitals  Enc Vitals  Group     BP 08/16/18 1557 (!) 161/135     Pulse Rate 08/16/18 1557 (!) 133     Resp 08/16/18 1557 (!) 22     Temp 08/16/18 1557 98.4 F (36.9 C)     Temp Source 08/16/18 1557 Oral     SpO2 08/16/18 1557 100 %     Weight 08/16/18 1556 300 lb (136.1 kg)     Height 08/16/18 1556 5\' 9"  (1.753 m)     Pain Score 08/16/18 1556 7   Constitutional: Alert and oriented. Sitting up in bed and appears uncomfortable.  Eyes: Conjunctivae are normal.  Head: Atraumatic. Nose: No congestion/rhinnorhea. Mouth/Throat: Mucous membranes are moist.  Oropharynx non-erythematous. Neck: No stridor.   Cardiovascular: Tachycardia. Good peripheral circulation. Grossly normal heart sounds.   Respiratory: Normal respiratory effort.  No retractions. Lungs CTAB. Gastrointestinal: Soft with mild LLQ abdominal pain. No rebound or guarding. No distention.  Musculoskeletal: No lower extremity tenderness nor edema. No gross deformities of extremities. Neurologic:  Normal speech and language. No gross focal neurologic deficits are appreciated.  Skin:  Skin is warm, dry and intact. No rash noted.  ____________________________________________   LABS (all labs ordered are listed, but only abnormal results are displayed)  Labs Reviewed  COMPREHENSIVE METABOLIC PANEL - Abnormal; Notable for the following components:      Result Value   Glucose, Bld 121 (*)    All other components within normal limits  URINALYSIS, ROUTINE W REFLEX MICROSCOPIC - Abnormal; Notable for the following components:   APPearance HAZY (*)    Hgb urine dipstick  LARGE (*)    RBC / HPF >50 (*)    Bacteria, UA RARE (*)    All other components within normal limits  LIPASE, BLOOD  CBC  I-STAT BETA HCG BLOOD, ED ([facility], [facility], [facility] ONLY)   ____________________________________________  RADIOLOGY  Dg Chest 2 View  Result Date: 08/16/2018 CLINICAL DATA:  Onset of sharp lower abdominal and pelvic pain while driving, had a pole over, history of kidney  stones and ovarian cyst EXAM: CHEST - 2 VIEW COMPARISON:  None FINDINGS: Normal heart size, mediastinal contours, and pulmonary vascularity. Lungs clear. No pleural effusion or pneumothorax. Bones unremarkable. IMPRESSION: Normal exam. Electronically Signed   By: Ulyses Southward M.D.   On: 08/16/2018 18:33   US Transvaginal Non-ob  Result Date: 08/16/2018 CLINICAL DATA:  Initial evaluation for acute left lower quadrant abdominal pain. EXAM: TRANSABDOMINAL AND TRANSVAGINAL ULTRASOUND OF PELVIS DOPPLER ULTRASOUND OF OVARIES TECHNIQUE: Both transabdominal and transvaginal ultrasound examinations of the pelvis were performed. Transabdominal technique was performed for global imaging of the pelvis including uterus, ovaries, adnexal regions, and pelvic cul-de-sac. It was necessary to proceed with endovaginal exam following the transabdominal exam to visualize the uterus, endometrium, and ovaries. Color and duplex Doppler ultrasound was utilized to evaluate blood flow to the ovaries. COMPARISON:  Prior CT from 08/28/2016 FINDINGS: Uterus Measurements: 7.2 x 3.7 x 4.8 cm. No fibroids or other mass visualized. Endometrium Thickness: 3 mm. Focal echogenic lesion measuring 1.1 x 0.6 x 0.9 cm with suggestion of vascular skull stalk, suggestive of a small polyp. Right ovary Measurements: 2.9 x 2.6 x 1.9 cm. Normal appearance/no adnexal mass. Left ovary Measurements: 2.8 x 1.6 x 1.6 cm. Normal appearance/no adnexal mass. Pulsed Doppler evaluation of the right ovary demonstrates normal low-resistance arterial and venous waveforms. Doppler interrogation of the left ovary was unable to be performed due to patient discomfort. While Doppler was not able to be performed, the left ovary itself is normal in appearance on grayscale imaging with no secondary signs to suggest torsion. Other findings Trace free physiologic fluid within the pelvis. IMPRESSION: 1. 1.1 cm echogenic lesion within the endometrial cavity with suggestion of  associated vascular stalk, most suggestive of a possible endometrial polyp. Consider follow-up examination with nonemergent sonohysterogram for further evaluation, prior to hysteroscopy or endometrial biopsy. 2. No other acute abnormality within the pelvis. Please note that Doppler interrogation of the left ovary was unable to be performed due to patient discomfort. Although Doppler was not performed, the left ovary itself is normal in appearance on grayscale imaging with no secondary signs to suggest torsion. If there is high clinical suspicion for possible occult torsion, a re-attempt at Doppler interrogation could be made as clinically warranted and as the patient can tolerate. Right ovary is normal in appearance without evidence for torsion. 3. Otherwise unremarkable and normal pelvic ultrasound. Electronically Signed   By: Rise Mu M.D.   On: 08/16/2018 19:53   US Pelvis Complete  Result Date: 08/16/2018 CLINICAL DATA:  Initial evaluation for acute left lower quadrant abdominal pain. EXAM: TRANSABDOMINAL AND TRANSVAGINAL ULTRASOUND OF PELVIS DOPPLER ULTRASOUND OF OVARIES TECHNIQUE: Both transabdominal and transvaginal ultrasound examinations of the pelvis were performed. Transabdominal technique was performed for global imaging of the pelvis including uterus, ovaries, adnexal regions, and pelvic cul-de-sac. It was necessary to proceed with endovaginal exam following the transabdominal exam to visualize the uterus, endometrium, and ovaries. Color and duplex Doppler ultrasound was utilized to evaluate blood flow to the ovaries. COMPARISON:  Prior CT from  08/28/2016 FINDINGS: Uterus Measurements: 7.2 x 3.7 x 4.8 cm. No fibroids or other mass visualized. Endometrium Thickness: 3 mm. Focal echogenic lesion measuring 1.1 x 0.6 x 0.9 cm with suggestion of vascular skull stalk, suggestive of a small polyp. Right ovary Measurements: 2.9 x 2.6 x 1.9 cm. Normal appearance/no adnexal mass. Left ovary  Measurements: 2.8 x 1.6 x 1.6 cm. Normal appearance/no adnexal mass. Pulsed Doppler evaluation of the right ovary demonstrates normal low-resistance arterial and venous waveforms. Doppler interrogation of the left ovary was unable to be performed due to patient discomfort. While Doppler was not able to be performed, the left ovary itself is normal in appearance on grayscale imaging with no secondary signs to suggest torsion. Other findings Trace free physiologic fluid within the pelvis. IMPRESSION: 1. 1.1 cm echogenic lesion within the endometrial cavity with suggestion of associated vascular stalk, most suggestive of a possible endometrial polyp. Consider follow-up examination with nonemergent sonohysterogram for further evaluation, prior to hysteroscopy or endometrial biopsy. 2. No other acute abnormality within the pelvis. Please note that Doppler interrogation of the left ovary was unable to be performed due to patient discomfort. Although Doppler was not performed, the left ovary itself is normal in appearance on grayscale imaging with no secondary signs to suggest torsion. If there is high clinical suspicion for possible occult torsion, a re-attempt at Doppler interrogation could be made as clinically warranted and as the patient can tolerate. Right ovary is normal in appearance without evidence for torsion. 3. Otherwise unremarkable and normal pelvic ultrasound. Electronically Signed   By: Rise Mu M.D.   On: 08/16/2018 19:53   [place] Art/ven Flow Abd Pelv Doppler  Result Date: 08/16/2018 CLINICAL DATA:  Left lower quadrant pain EXAM: DOPPLER ULTRASOUND OF LEFT OVARY TECHNIQUE: Color and duplex Doppler ultrasound was utilized to evaluate blood flow to the left ovary COMPARISON:  None. FINDINGS: Pulsed Doppler evaluation of the left ovary demonstrates normal low-resistance arterial and venous waveforms. IMPRESSION: Normal Doppler examination of the left ovary. Electronically Signed   By: Deatra Robinson M.D.   On: 08/16/2018 23:25   [place] Art/ven Flow Abd Pelv Doppler  Result Date: 08/16/2018 CLINICAL DATA:  Initial evaluation for acute left lower quadrant abdominal pain. EXAM: TRANSABDOMINAL AND TRANSVAGINAL ULTRASOUND OF PELVIS DOPPLER ULTRASOUND OF OVARIES TECHNIQUE: Both transabdominal and transvaginal ultrasound examinations of the pelvis were performed. Transabdominal technique was performed for global imaging of the pelvis including uterus, ovaries, adnexal regions, and pelvic cul-de-sac. It was necessary to proceed with endovaginal exam following the transabdominal exam to visualize the uterus, endometrium, and ovaries. Color and duplex Doppler ultrasound was utilized to evaluate blood flow to the ovaries. COMPARISON:  Prior CT from 08/28/2016 FINDINGS: Uterus Measurements: 7.2 x 3.7 x 4.8 cm. No fibroids or other mass visualized. Endometrium Thickness: 3 mm. Focal echogenic lesion measuring 1.1 x 0.6 x 0.9 cm with suggestion of vascular skull stalk, suggestive of a small polyp. Right ovary Measurements: 2.9 x 2.6 x 1.9 cm. Normal appearance/no adnexal mass. Left ovary Measurements: 2.8 x 1.6 x 1.6 cm. Normal appearance/no adnexal mass. Pulsed Doppler evaluation of the right ovary demonstrates normal low-resistance arterial and venous waveforms. Doppler interrogation of the left ovary was unable to be performed due to patient discomfort. While Doppler was not able to be performed, the left ovary itself is normal in appearance on grayscale imaging with no secondary signs to suggest torsion. Other findings Trace free physiologic fluid within the pelvis. IMPRESSION: 1. 1.1 cm echogenic lesion within  the endometrial cavity with suggestion of associated vascular stalk, most suggestive of a possible endometrial polyp. Consider follow-up examination with nonemergent sonohysterogram for further evaluation, prior to hysteroscopy or endometrial biopsy. 2. No other acute abnormality within the pelvis. Please  note that Doppler interrogation of the left ovary was unable to be performed due to patient discomfort. Although Doppler was not performed, the left ovary itself is normal in appearance on grayscale imaging with no secondary signs to suggest torsion. If there is high clinical suspicion for possible occult torsion, a re-attempt at Doppler interrogation could be made as clinically warranted and as the patient can tolerate. Right ovary is normal in appearance without evidence for torsion. 3. Otherwise unremarkable and normal pelvic ultrasound. Electronically Signed   By: Rise Mu M.D.   On: 08/16/2018 19:53   Ct Renal Stone Study  Result Date: 08/16/2018 CLINICAL DATA:  Initial evaluation for acute left flank pain, left lower quadrant abdominal pain. EXAM: CT ABDOMEN AND PELVIS WITHOUT CONTRAST TECHNIQUE: Multidetector CT imaging of the abdomen and pelvis was performed following the standard protocol without IV contrast. COMPARISON:  Prior ultrasound from earlier the same day as well as previous CT from 08/28/2016. FINDINGS: Lower chest: Visualized lung bases are clear. Hepatobiliary: Liver demonstrates a normal unenhanced appearance. Gallbladder within normal limits. No biliary dilatation. Pancreas: Pancreas within normal limits. Spleen: Unremarkable. Adrenals/Urinary Tract: Adrenal glands are normal. Kidneys equal in size without hydronephrosis. Punctate nonobstructive nephrolithiasis seen bilaterally. No radiopaque calculi seen along the course of either renal collecting system. No hydroureter. Partially distended bladder demonstrates no acute finding. No layering stones seen within the bladder lumen. Stomach/Bowel: Stomach within normal limits. No evidence for bowel obstruction. No acute inflammatory changes seen about the bowels. Vascular/Lymphatic: Intra-abdominal aorta of normal caliber. No adenopathy. Reproductive: Uterus and ovaries within normal limits. Other: No free air or fluid.  Musculoskeletal: No acute osseous abnormality. No worrisome lytic or blastic osseous lesions. 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 IMPRESSION: 1. Punctate nonobstructive bilateral nephrolithiasis. No ureterolithiasis or evidence for obstructive uropathy. 2. No other acute intra-abdominal or pelvic process. Electronically Signed   By: Rise Mu M.D.   On: 08/16/2018 21:15    ____________________________________________   PROCEDURES  Procedure(s) performed:   Procedures  None ____________________________________________   INITIAL IMPRESSION / ASSESSMENT AND PLAN / ED COURSE  Pertinent labs & imaging results that were available during my care of the patient were reviewed by me and considered in my medical decision making (see chart for details).  Presents to the emergency department for evaluation of due to onset left lower quadrant abdominal pain which is severe and with associated nausea and vomiting.  Patient had thorough evaluation of this pain earlier this month with transvaginal ultrasound and CT which showed a small left ovarian cyst.  Intermittent torsion is a consideration although not high my differential.  The patient does have significant tachycardia but denies any chest pain/shortness of breath symptoms.  While I am in the room the patient's oxygen saturation is low at times but this seems to mostly be related to inconsistent pulse ox reading.  I will obtain a screening chest x-ray and repeat transvaginal ultrasound along with labs and urinalysis.  08:06 PM Patient returned form [place] with continued pain. Could not tolerate [place] to get doppler but left ovary was visualized without 2/2 evidence of torsion. Discussed with patient. Will obtain a CT renal given stone history. If no stone, discussed with patient that we will need to return to [place]  for re-attempt at TVUS in attempt to obtain doppler.   CT negative for acute process. Pain much improved after Morphine. Patient  returned to US for doppler of the left ovary which was successful. Normal flow. Plan for Gyn f/u and PCP evaluation.   At this time, I do not feel there is any life-threatening condition present. I have reviewed and discussed all results (EKG, imaging, lab, urine as appropriate), exam findings with patient. I have reviewed nursing notes and appropriate previous records.  I feel the patient is safe to be discharged home without further emergent workup. Discussed usual and customary return precautions. Patient and family (if present) verbalize understanding and are comfortable with this plan.  Patient will follow-up with their primary care provider. If they do not have a primary care provider, information for follow-up has been provided to them. All questions have been answered.  ____________________________________________  FINAL CLINICAL IMPRESSION(S) / ED DIAGNOSES  Final diagnoses:  Left lower quadrant abdominal pain  Non-intractable vomiting with nausea, unspecified vomiting type     MEDICATIONS GIVEN DURING THIS VISIT:  Medications  sodium chloride 0.9 % bolus 1,000 mL (0 mLs Intravenous Stopped 08/16/18 1917)  ketorolac (TORADOL) 30 MG/ML injection 30 mg (30 mg Intravenous Given 08/16/18 1647)  ondansetron (ZOFRAN) injection 4 mg (4 mg Intravenous Given 08/16/18 1647)  morphine 4 MG/ML injection 4 mg (4 mg Intravenous Given 08/16/18 2013)  ondansetron (ZOFRAN) injection 4 mg (4 mg Intravenous Given 08/16/18 2059)  morphine 4 MG/ML injection 4 mg (4 mg Intravenous Given 08/16/18 2214)    Note:  This document was prepared using Dragon voice recognition software and may include unintentional dictation errors.  Alona BeneJoshua Abdias Hickam, MD Emergency Medicine    Judas Mohammad, Arlyss RepressJoshua G, MD 08/17/18 [phone]

## 2018-08-16 NOTE — ED Notes (Signed)
Patient transported to X-ray 

## 2018-08-16 NOTE — ED Triage Notes (Signed)
Pt states she was driving her car and suddenly felt sharp pain in left lower abd. She pulled over and vomited. Pt now feels nauseous and continues to have very sharp lower abd pain. Denies any urinary symptoms. Pt states she had surgery a month ago to remove kidney stones on left side

## 2018-12-07 ENCOUNTER — Emergency Department: Payer: BLUE CROSS/BLUE SHIELD | Primary: Family Medicine

## 2018-12-07 DIAGNOSIS — R109 Unspecified abdominal pain: Secondary | ICD-10-CM

## 2018-12-07 NOTE — ED Provider Notes (Signed)
ED Provider Notes by Hal Morales, MD at 12/07/18 2059                Author: Hal Morales, MD  Service: Emergency Medicine  Author Type: Physician       Filed: 12/08/18 0121  Date of Service: 12/07/18 2059  Status: Signed          Editor: Hal Morales, MD (Physician)               Here with right flank pain.  She had similar pain approximately 2 weeks ago.  States that she had a right-sided  4 mm stone.  She denies any fevers or chills.  Dates that she had surgery done for a stone last October.  She lives in [place] and states that Dr. Carolan Clines is her urologist.  Denies any urinary changes such as hematuria or dysuria.  States that she passed  the stone in our department.  There is a stone on a piece of dry tissue paper.      Patient on review is had CT scans looking for stones on March 2 and January 20 of this year these both in [place].  She had a CT urogram at [facility] on 11/25, [facility] 11/12, [facility] 11/10, [facility] 8/31, [facility] 7/17, and [facility] 5/14      States she is here with her mother helping her aunt      The history is provided by the patient.    Flank Pain     This is a new problem. Patient reports not work related injury.The pain is associated with no known injury. The  pain is present in the lower back and right side. The pain does not radiate. Associated  symptoms include abdominal pain. She has tried nothing for the symptoms.    Abdominal Pain                 Past Medical History:        Diagnosis  Date         ?  Kidney calculi             History reviewed. No pertinent surgical history.        History reviewed. No pertinent family history.        Social History          Socioeconomic History         ?  Marital status:  SINGLE              Spouse name:  Not on file         ?  Number of children:  Not on file     ?  Years of education:  Not on file     ?  Highest education level:  Not on file       Occupational History        ?  Not on file       Social  Needs         ?  Financial resource strain:  Not on file        ?  Food insecurity              Worry:  Not on file         Inability:  Not on file        ?  Transportation needs              Medical:  Not on file  Non-medical:  Not on file       Tobacco Use         ?  Smoking status:  Never Smoker     ?  Smokeless tobacco:  Never Used       Substance and Sexual Activity         ?  Alcohol use:  No     ?  Drug use:  No     ?  Sexual activity:  Not on file       Lifestyle        ?  Physical activity              Days per week:  Not on file         Minutes per session:  Not on file         ?  Stress:  Not on file       Relationships        ?  Social Engineer, manufacturing systems on phone:  Not on file         Gets together:  Not on file         Attends religious service:  Not on file         Active member of club or organization:  Not on file         Attends meetings of clubs or organizations:  Not on file         Relationship status:  Not on file        ?  Intimate partner violence              Fear of current or ex partner:  Not on file         Emotionally abused:  Not on file         Physically abused:  Not on file         Forced sexual activity:  Not on file        Other Topics  Concern        ?  Not on file       Social History Narrative        ?  Not on file              ALLERGIES: Patient has no known allergies.      Review of Systems    Gastrointestinal: Positive for abdominal pain.    Genitourinary: Positive for flank pain.    All other systems reviewed and are negative.           Vitals:          12/07/18 2027        BP:  161/84     Pulse:  (!) 114     Resp:  18     Temp:  98 ??F (36.7 ??C)     SpO2:  100%     Weight:  127 kg (280 lb)        Height:  5\' 9"  (1.753 m)                Physical Exam   Constitutional :        Appearance: She is well-developed. She is obese. She is not toxic-appearing or diaphoretic.   HENT:       Head: Atraumatic.   Cardiovascular :       Rate and Rhythm: Regular rhythm.  Pulmonary :       Effort: Pulmonary effort is normal.   Abdominal:      Palpations: Abdomen is soft.      Tenderness: There is no abdominal tenderness.      Hernia:  No hernia is present.    Skin:      Coloration: Skin is not jaundiced or pale.   Neurological :       General: No focal deficit present.      Mental Status: She is alert.    Psychiatric:         Mood and Affect: Mood normal.              MDM   Number of Diagnoses or Management Options   Flank pain:    Diagnosis management comments: Dates her pain resolved before going to ultrasound and presents with a 2 to 3 mm ureteral lithiasis type stone she states she passed.          Amount and/or Complexity of Data Reviewed   Clinical lab tests: reviewed and ordered    Decide to obtain previous medical records or to obtain history from someone other than the patient: yes      Risk of Complications, Morbidity, and/or Mortality   Presenting problems: moderate  Diagnostic procedures: low  Management options: moderate     Patient Progress   Patient progress: resolved             Procedures                        XR Chest Pa And Lateral3/10/2018   [facility]     Result Impression     IMPRESSION:    No evidence of active disease.    Electronically Signed by: Junious Silk, MD     Result Narrative       FRONTAL AND LATERAL RADIOGRAPHS OF THE CHEST:    INDICATION: Fever.    COMPARISON: None available    FINDINGS:    The cardiac  silhouette is unremarkable.  Pulmonary vasculature is normal.  There is no evidence of pneumothorax or pleural effusion.  The lungs are clear without evidence of focal consolidation.  The osseous structures are age-appropriate without  acute abnormality.       Other Result Information     Acute Interface, Incoming Rad Results - 11/22/2018 10:48 PM EST  FRONTAL AND LATERAL RADIOGRAPHS OF THE CHEST:    INDICATION: Fever.     COMPARISON: None available    FINDINGS:    The cardiac silhouette is unremarkable.  Pulmonary vasculature is  normal.  There is no evidence of pneumothorax or pleural effusion.  The lungs are clear without evidence of focal consolidation.   The osseous structures are age-appropriate without acute abnormality.      IMPRESSION:    No evidence of active disease.    Electronically Signed by: Junious Silk, MD        Status  Results Details     ??  Encounter Summary       CT Abdomen Pelvis WO IV Contrast Larina Bras Protocol)11/22/2018   [facility]     Result Impression     IMPRESSION:    Tiny bilateral nonobstructing renal calculi.     Otherwise unremarkable CT of the abdomen and pelvis.    Electronically Signed by: Forest Gleason PhD, MD     Result Narrative       PROVIDED CLINICAL INFORMATION: H/O  STONES, NOW WITH FEVER  ADDITIONAL CLINICAL INFORMATION: None    TECHNIQUE: Axial CT images of the abdomen  and pelvis were acquired without intravenous contrast. Coronal and sagittal images were obtained. CT dose reduction techniques were utilized.    COMPARISON: CT abdomen and pelvis 10/10/2018    FINDINGS:    Visualized lung bases are  clear.    Liver, gallbladder spleen, pancreas, and adrenal glands appear normal.    Tiny nonobstructing renal calculi are again noted bilaterally. Kidneys otherwise unremarkable without hydronephrosis.    Bladder, uterus, and ovaries  appear normal.    No evidence of bowel obstruction. No pneumatosis or pneumoperitoneum. Appendix not definitively seen but there are no inflammatory changes in the right lower quadrant.    Aorta and IVC are unremarkable. No adenopathy. No  ascites.    Mild degenerative changes in the lower lumbar spine.       Other Result Information       Acute Interface, Incoming Rad Results - 11/22/2018 10:40 PM EST  PROVIDED CLINICAL INFORMATION: H/O STONES, NOW WITH FEVER  ADDITIONAL CLINICAL  INFORMATION: None    TECHNIQUE: Axial CT images of the abdomen and pelvis were acquired without intravenous contrast. Coronal and sagittal images were obtained. CT dose reduction  techniques were utilized.    COMPARISON: CT abdomen and pelvis  10/10/2018    FINDINGS:    Visualized lung bases are clear.    Liver, gallbladder spleen, pancreas, and adrenal glands appear normal.    Tiny nonobstructing renal calculi  are again noted bilaterally. Kidneys otherwise unremarkable without hydronephrosis.    Bladder, uterus, and ovaries appear normal.    No evidence of bowel obstruction. No pneumatosis or pneumoperitoneum. Appendix not definitively seen but  there are no inflammatory changes in the right lower quadrant.    Aorta and IVC are unremarkable. No adenopathy. No ascites.    Mild degenerative changes in the lower lumbar spine.      IMPRESSION:     Tiny bilateral nonobstructing renal calculi.    Otherwise unremarkable CT of the abdomen and pelvis.    Electronically Signed by: Rella LarveEmmanuel

## 2018-12-07 NOTE — ED Notes (Signed)
Patient presents from home with pain in her abdomen/right flank area that started this morning when she woke up. She passed a stone 2 weeks ago and after was told she had a 79mm one left in her right kidney that she had not passed. She is thinking this is what is causing her pain today. Denies any vomiting. Has been nauseas all day.

## 2018-12-07 NOTE — ED Notes (Signed)
Pt urinated and passed her kidney stone. MD put it in a red top tube for pt to take to her urologist. Pt states pain is relieved and denies n/v at this time.

## 2018-12-07 NOTE — ED Notes (Signed)
Urine sent to the lab

## 2018-12-07 NOTE — ED Notes (Signed)
I have reviewed discharge instructions with the patient.  The patient verbalized understanding.    Patient left ED via Discharge Method: ambulatory to Home with self.    Opportunity for questions and clarification provided.       Patient given 0 scripts.         To continue your aftercare when you leave the hospital, you may receive an automated call from our care team to check in on how you are doing.  This is a free service and part of our promise to provide the best care and service to meet your aftercare needs." If you have questions, or wish to unsubscribe from this service please call [phone].  Thank you for Choosing our [facility].

## 2018-12-07 NOTE — ED Provider Notes (Signed)
Here with right flank pain.  Patricia Cohen had similar pain approximately 2 weeks ago.  States that Patricia Cohen had a right-sided 4 mm stone.  Patricia Cohen denies any fevers or chills.  Dates that Patricia Cohen had surgery done for a stone last October.  Patricia Cohen lives in [place] and states that Dr. Carolan Clines is her urologist.  Denies any urinary changes such as hematuria or dysuria.  States that Patricia Cohen passed the stone in our department.  There is a stone on a piece of dry tissue paper.    Patient on review is had CT scans looking for stones on March 2 and January 20 of this year these both in [place].  Patricia Cohen had a CT urogram at [facility] on 11/25, [facility] 11/12, [facility] 11/10, [facility] Gower 8/31, [facility] Gower 7/17, and Clar[facility] Gower 5/14    States Patricia Cohen is here with her mother helping her aunt    The history is provided by the patient.   Flank Pain    This is a new problem. Patient reports not work related injury.The pain is associated with no known injury. The pain is present in the lower back and right side. The pain does not radiate. Associated symptoms include abdominal pain. Patricia Cohen has tried nothing for the symptoms.   Abdominal Pain           Past Medical History:   Diagnosis Date   ??? Kidney calculi        History reviewed. No pertinent surgical history.      History reviewed. No pertinent family history.    Social History     Socioeconomic History   ??? Marital status: SINGLE     Spouse name: Not on file   ??? Number of children: Not on file   ??? Years of education: Not on file   ??? Highest education level: Not on file   Occupational History   ??? Not on file   Social Needs   ??? Financial resource strain: Not on file   ??? Food insecurity     Worry: Not on file     Inability: Not on file   ??? Transportation needs     Medical: Not on file     Non-medical: Not on file   Tobacco Use   ??? Smoking status: Never Smoker   ??? Smokeless tobacco: Never Used   Substance and Sexual Activity   ??? Alcohol use: No   ??? Drug use: No   ??? Sexual activity: Not on file   Lifestyle    ??? Physical activity     Days per week: Not on file     Minutes per session: Not on file   ??? Stress: Not on file   Relationships   ??? Social Wellsite geologist on phone: Not on file     Gets together: Not on file     Attends religious service: Not on file     Active member of club or organization: Not on file     Attends meetings of clubs or organizations: Not on file     Relationship status: Not on file   ??? Intimate partner violence     Fear of current or ex partner: Not on file     Emotionally abused: Not on file     Physically abused: Not on file     Forced sexual activity: Not on file   Other Topics Concern   ??? Not on file   Social History Narrative   ??? Not on  file         ALLERGIES: Patient has no known allergies.    Review of Systems   Gastrointestinal: Positive for abdominal pain.   Genitourinary: Positive for flank pain.   All other systems reviewed and are negative.      Vitals:    12/07/18 2027   BP: 161/84   Pulse: (!) 114   Resp: 18   Temp: 98 ??F (36.7 ??C)   SpO2: 100%   Weight: 127 kg (280 lb)   Height: 5\' 9"  (1.753 m)            Physical Exam  Constitutional:       Appearance: Patricia Cohen is well-developed. Patricia Cohen is obese. Patricia Cohen is not toxic-appearing or diaphoretic.   HENT:      Head: Atraumatic.   Cardiovascular:      Rate and Rhythm: Regular rhythm.   Pulmonary:      Effort: Pulmonary effort is normal.   Abdominal:      Palpations: Abdomen is soft.      Tenderness: There is no abdominal tenderness.      Hernia: No hernia is present.   Skin:     Coloration: Skin is not jaundiced or pale.   Neurological:      General: No focal deficit present.      Mental Status: Patricia Cohen is alert.   Psychiatric:         Mood and Affect: Mood normal.          MDM  Number of Diagnoses or Management Options  Flank pain:   Diagnosis management comments: Dates her pain resolved before going to ultrasound and presents with a 2 to 3 mm ureteral lithiasis type stone Patricia Cohen states Patricia Cohen passed.       Amount and/or Complexity of Data Reviewed   Clinical lab tests: reviewed and ordered  Decide to obtain previous medical records or to obtain history from someone other than the patient: yes    Risk of Complications, Morbidity, and/or Mortality  Presenting problems: moderate  Diagnostic procedures: low  Management options: moderate    Patient Progress  Patient progress: resolved         Procedures               XR Chest Pa And Lateral3/10/2018  [facility]  Result Impression   IMPRESSION:    No evidence of active disease.    Electronically Signed by: Junious Silk, MD   Result Narrative   FRONTAL AND LATERAL RADIOGRAPHS OF THE CHEST:    INDICATION: Fever.    COMPARISON: None available    FINDINGS:    The cardiac silhouette is unremarkable.  Pulmonary vasculature is normal.  There is no evidence of pneumothorax or pleural effusion.  The lungs are clear without evidence of focal consolidation.  The osseous structures are age-appropriate without acute abnormality.   Other Result Information   Acute Interface, Incoming Rad Results - 11/22/2018 10:48 PM EST  FRONTAL AND LATERAL RADIOGRAPHS OF THE CHEST:    INDICATION: Fever.    COMPARISON: None available    FINDINGS:    The cardiac silhouette is unremarkable.  Pulmonary vasculature is normal.  There is no evidence of pneumothorax or pleural effusion.  The lungs are clear without evidence of focal consolidation.  The osseous structures are age-appropriate without acute abnormality.      IMPRESSION:    No evidence of active disease.    Electronically Signed by: Junious Silk, MD   Status  Results Details   ?? Encounter Summary   CT Abdomen Pelvis WO IV Contrast Larina Bras Protocol)11/22/2018  [facility]  Result Impression   IMPRESSION:    Tiny bilateral nonobstructing renal calculi.    Otherwise unremarkable CT of the abdomen and pelvis.    Electronically Signed by: Forest Gleason PhD, MD   Result Narrative   PROVIDED CLINICAL INFORMATION: H/O STONES, NOW WITH FEVER  ADDITIONAL CLINICAL INFORMATION: None     TECHNIQUE: Axial CT images of the abdomen and pelvis were acquired without intravenous contrast. Coronal and sagittal images were obtained. CT dose reduction techniques were utilized.    COMPARISON: CT abdomen and pelvis 10/10/2018    FINDINGS:    Visualized lung bases are clear.    Liver, gallbladder spleen, pancreas, and adrenal glands appear normal.    Tiny nonobstructing renal calculi are again noted bilaterally. Kidneys otherwise unremarkable without hydronephrosis.    Bladder, uterus, and ovaries appear normal.    No evidence of bowel obstruction. No pneumatosis or pneumoperitoneum. Appendix not definitively seen but there are no inflammatory changes in the right lower quadrant.    Aorta and IVC are unremarkable. No adenopathy. No ascites.    Mild degenerative changes in the lower lumbar spine.   Other Result Information   Acute Interface, Incoming Rad Results - 11/22/2018 10:40 PM EST  PROVIDED CLINICAL INFORMATION: H/O STONES, NOW WITH FEVER  ADDITIONAL CLINICAL INFORMATION: None    TECHNIQUE: Axial CT images of the abdomen and pelvis were acquired without intravenous contrast. Coronal and sagittal images were obtained. CT dose reduction techniques were utilized.    COMPARISON: CT abdomen and pelvis 10/10/2018    FINDINGS:    Visualized lung bases are clear.    Liver, gallbladder spleen, pancreas, and adrenal glands appear normal.    Tiny nonobstructing renal calculi are again noted bilaterally. Kidneys otherwise unremarkable without hydronephrosis.    Bladder, uterus, and ovaries appear normal.    No evidence of bowel obstruction. No pneumatosis or pneumoperitoneum. Appendix not definitively seen but there are no inflammatory changes in the right lower quadrant.    Aorta and IVC are unremarkable. No adenopathy. No ascites.    Mild degenerative changes in the lower lumbar spine.      IMPRESSION:    Tiny bilateral nonobstructing renal calculi.    Otherwise unremarkable CT of the abdomen and pelvis.     Electronically Signed by: Rella Larve

## 2018-12-07 NOTE — ED Notes (Signed)

## 2018-12-07 NOTE — ED Notes (Signed)
Pt urinated and passed her kidney stone. MD put it in a red top tube for pt to take to her urologist. Pt states pain is relieved and denies n/v at this time.

## 2018-12-07 NOTE — ED Triage Notes (Signed)
Patient presents from home with pain in her abdomen/right flank area that started this morning when she woke up. She passed a stone 2 weeks ago and after was told she had a 4mm one left in her right kidney that she had not passed. She is thinking this is what is causing her pain today. Denies any vomiting. Has been nauseas all day.

## 2018-12-08 ENCOUNTER — Inpatient Hospital Stay
Admit: 2018-12-08 | Discharge: 2018-12-08 | Disposition: A | Discharge status: Home / Self Care | Payer: BLUE CROSS/BLUE SHIELD | Attending: Emergency Medicine

## 2018-12-08 LAB — CBC WITH AUTOMATED DIFF
ABS. BASOPHILS: 0.1 10*3/uL (ref 0.0–0.2)
ABS. EOSINOPHILS: 0.2 10*3/uL (ref 0.0–0.8)
ABS. IMM. GRANS.: 0.1 10*3/uL (ref 0.0–0.5)
ABS. LYMPHOCYTES: 2.4 10*3/uL (ref 0.5–4.6)
ABS. MONOCYTES: 1.2 10*3/uL (ref 0.1–1.3)
ABS. NEUTROPHILS: 6.7 10*3/uL (ref 1.7–8.2)
ABSOLUTE NRBC: 0 10*3/uL (ref 0.0–0.2)
BASOPHILS: 1 % (ref 0.0–2.0)
EOSINOPHILS: 2 % (ref 0.5–7.8)
HCT: 45 % (ref 35.8–46.3)
HGB: 14.5 g/dL (ref 11.7–15.4)
IMMATURE GRANULOCYTES: 1 % (ref 0.0–5.0)
LYMPHOCYTES: 23 % (ref 13–44)
MCH: 26.7 PG (ref 26.1–32.9)
MCHC: 32.2 g/dL (ref 31.4–35.0)
MCV: 82.7 FL (ref 79.6–97.8)
MONOCYTES: 12 % (ref 4.0–12.0)
MPV: 9 FL — ABNORMAL LOW (ref 9.4–12.3)
NEUTROPHILS: 63 % (ref 43–78)
PLATELET: 406 10*3/uL (ref 150–450)
RBC: 5.44 M/uL — ABNORMAL HIGH (ref 4.05–5.2)
RDW: 13.8 % (ref 11.9–14.6)
WBC: 10.7 10*3/uL (ref 4.3–11.1)

## 2018-12-08 LAB — METABOLIC PANEL, COMPREHENSIVE
A-G Ratio: 0.9 — ABNORMAL LOW (ref 1.2–3.5)
ALT (SGPT): 38 U/L (ref 12–65)
AST (SGOT): 14 U/L — ABNORMAL LOW (ref 15–37)
Albumin: 3.9 g/dL (ref 3.5–5.0)
Alk. phosphatase: 87 U/L (ref 50–136)
Anion gap: 9 mmol/L (ref 7–16)
BUN: 13 MG/DL (ref 6–23)
Bilirubin, total: 0.5 MG/DL (ref 0.2–1.1)
CO2: 23 mmol/L (ref 21–32)
Calcium: 9.4 MG/DL (ref 8.3–10.4)
Chloride: 104 mmol/L (ref 98–107)
Creatinine: 0.76 MG/DL (ref 0.6–1.0)
GFR est AA: >60 mL/min/{1.73_m2} (ref >60)
GFR est non-AA: >60 mL/min/{1.73_m2} (ref >60)
Globulin: 4.5 g/dL — ABNORMAL HIGH (ref 2.3–3.5)
Glucose: 90 mg/dL (ref 65–100)
Potassium: 3.7 mmol/L (ref 3.5–5.1)
Protein, total: 8.4 g/dL — ABNORMAL HIGH (ref 6.3–8.2)
Sodium: 136 mmol/L (ref 136–145)

## 2018-12-08 LAB — DRUG SCREEN, URINE
AMPHETAMINES: NEGATIVE
Amphetamine Screen, Urine: NEGATIVE
BARBITURATES: NEGATIVE
BENZODIAZEPINES: NEGATIVE
Barbiturate Screen, Urine: NEGATIVE
Benzodiazepine Screen, Urine: NEGATIVE
COCAINE: NEGATIVE
Cocaine Screen Urine: NEGATIVE
METHADONE: NEGATIVE
Methadone Screen, Urine: NEGATIVE
OPIATES: NEGATIVE
Opiate Screen, Urine: NEGATIVE
PCP Screen, Urine: NEGATIVE
PCP(PHENCYCLIDINE): NEGATIVE
THC (TH-CANNABINOL): NEGATIVE
THC Screen, Urine: NEGATIVE

## 2018-12-08 LAB — LIPASE
Lipase: 161 U/L (ref 73–393)
Lipase: 161 U/L (ref 73–393)

## 2018-12-08 LAB — HCG URINE, QL. - POC
HCG, Pregnancy, Urine, POC: NEGATIVE
Pregnancy test,urine (POC): NEGATIVE

## 2018-12-08 LAB — COMPREHENSIVE METABOLIC PANEL
ALT: 38 U/L (ref 12–65)
AST: 14 U/L — ABNORMAL LOW (ref 15–37)
Albumin/Globulin Ratio: 0.9 — ABNORMAL LOW (ref 1.2–3.5)
Albumin: 3.9 g/dL (ref 3.5–5.0)
Alkaline Phosphatase: 87 U/L (ref 50–136)
Anion Gap: 9 mmol/L (ref 7–16)
BUN: 13 MG/DL (ref 6–23)
CO2: 23 mmol/L (ref 21–32)
Calcium: 9.4 MG/DL (ref 8.3–10.4)
Chloride: 104 mmol/L (ref 98–107)
Creatinine: 0.76 MG/DL (ref 0.6–1.0)
EGFR IF NonAfrican American: >60 mL/min/{1.73_m2} (ref >60)
GFR African American: >60 mL/min/{1.73_m2} (ref >60)
Globulin: 4.5 g/dL — ABNORMAL HIGH (ref 2.3–3.5)
Glucose: 90 mg/dL (ref 65–100)
Potassium: 3.7 mmol/L (ref 3.5–5.1)
Sodium: 136 mmol/L (ref 136–145)
Total Bilirubin: 0.5 MG/DL (ref 0.2–1.1)
Total Protein: 8.4 g/dL — ABNORMAL HIGH (ref 6.3–8.2)

## 2018-12-08 LAB — CBC WITH AUTO DIFFERENTIAL
Basophils %: 1 % (ref 0.0–2.0)
Basophils Absolute: 0.1 10*3/uL (ref 0.0–0.2)
Eosinophils %: 2 % (ref 0.5–7.8)
Eosinophils Absolute: 0.2 10*3/uL (ref 0.0–0.8)
Granulocyte Absolute Count: 0.1 10*3/uL (ref 0.0–0.5)
Hematocrit: 45 % (ref 35.8–46.3)
Hemoglobin: 14.5 g/dL (ref 11.7–15.4)
Immature Granulocytes: 1 % (ref 0.0–5.0)
Lymphocytes %: 23 % (ref 13–44)
Lymphocytes Absolute: 2.4 10*3/uL (ref 0.5–4.6)
MCH: 26.7 PG (ref 26.1–32.9)
MCHC: 32.2 g/dL (ref 31.4–35.0)
MCV: 82.7 FL (ref 79.6–97.8)
MPV: 9 FL — ABNORMAL LOW (ref 9.4–12.3)
Monocytes %: 12 % (ref 4.0–12.0)
Monocytes Absolute: 1.2 10*3/uL (ref 0.1–1.3)
NRBC Absolute: 0 10*3/uL (ref 0.0–0.2)
Neutrophils %: 63 % (ref 43–78)
Neutrophils Absolute: 6.7 10*3/uL (ref 1.7–8.2)
Platelets: 406 10*3/uL (ref 150–450)
RBC: 5.44 M/uL — ABNORMAL HIGH (ref 4.05–5.2)
RDW: 13.8 % (ref 11.9–14.6)
WBC: 10.7 10*3/uL (ref 4.3–11.1)

## 2018-12-08 MED ORDER — KETOROLAC TROMETHAMINE 30 MG/ML INJECTION
30 mg/mL (1 mL) | INTRAMUSCULAR | Status: AC
Start: 2018-12-08 — End: 2018-12-07
  Administered 2018-12-08: 02:00:00 via INTRAVENOUS

## 2018-12-08 MED ORDER — SODIUM CHLORIDE 0.9% BOLUS IV
0.9 % | Freq: Once | INTRAVENOUS | Status: AC
Start: 2018-12-08 — End: 2018-12-07
  Administered 2018-12-08: 02:00:00 via INTRAVENOUS

## 2018-12-08 MED ORDER — KETOROLAC TROMETHAMINE 15 MG/ML INJECTION
15 mg/mL | INTRAMUSCULAR | Status: DC
Start: 2018-12-08 — End: 2018-12-07

## 2018-12-08 MED FILL — KETOROLAC TROMETHAMINE 30 MG/ML INJECTION: 30 mg/mL (1 mL) | INTRAMUSCULAR | Qty: 1

## 2019-11-15 IMAGING — CT CT RENAL STONE PROTOCOL
2 of 4 series · 17 of 46 positions shown, 19 images · non-contrast
Comparison: Prior ultrasound from earlier the same day as well as
previous CT from 08/28/2016.

CLINICAL DATA: Initial evaluation for acute left flank pain, left
lower quadrant abdominal pain.

EXAM:
CT ABDOMEN AND PELVIS WITHOUT CONTRAST
TECHNIQUE: Multidetector CT imaging of the abdomen and pelvis was performed
following the standard protocol without IV contrast.

[Series 3: renal stone 5.0 · axial · 0.98mm/px · z∈[+226,+682]mm · 14 of 101 slices shown, 16 images]
[im 5/101  soft-tissue]
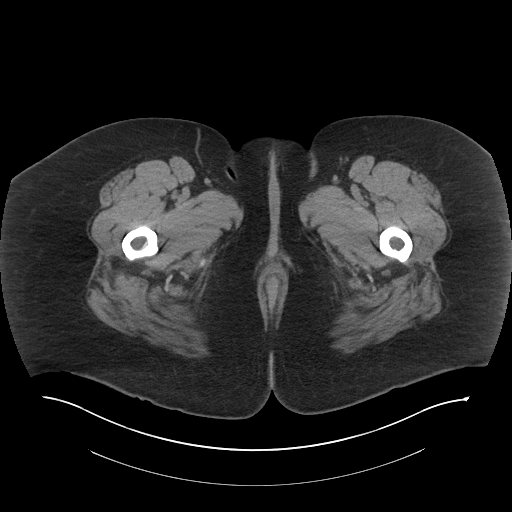
[im 5/101  bone]
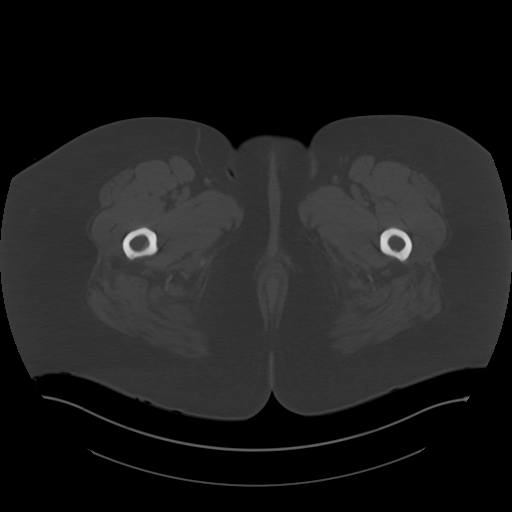
[im 13/101  soft-tissue]
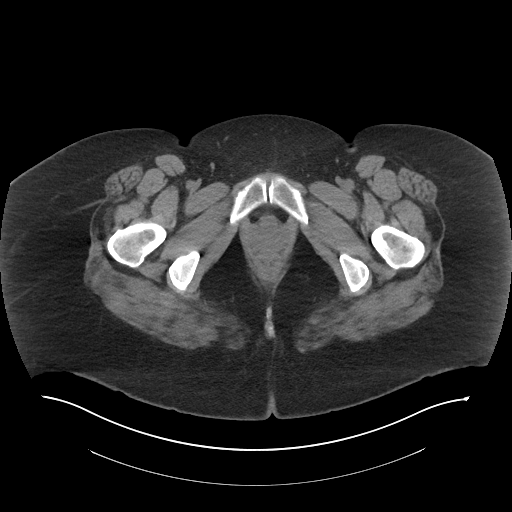
[im 21/101  soft-tissue]
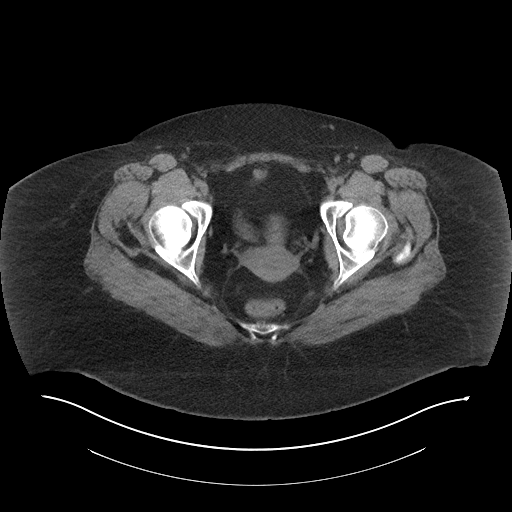
[im 26/101  soft-tissue]
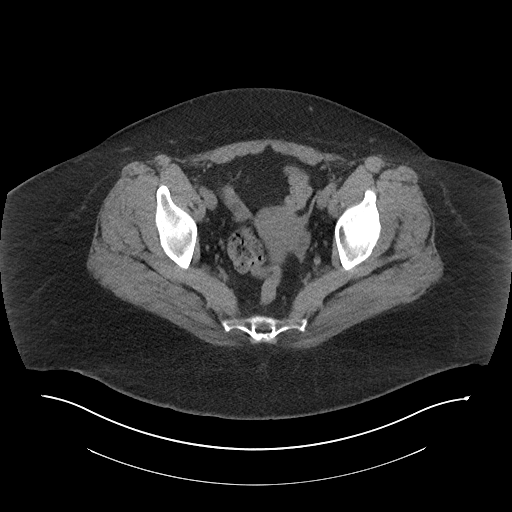
[im 34/101  soft-tissue]
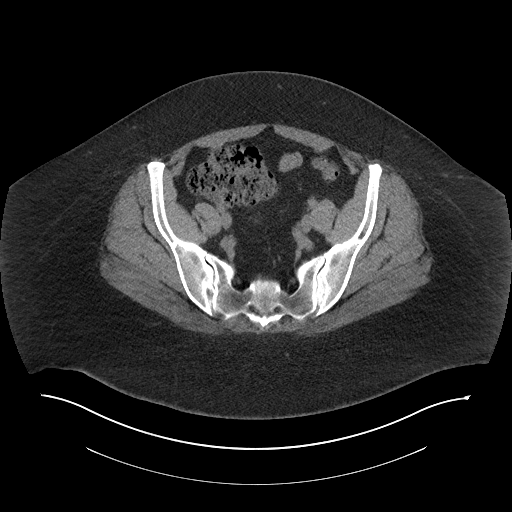
[im 42/101  soft-tissue]
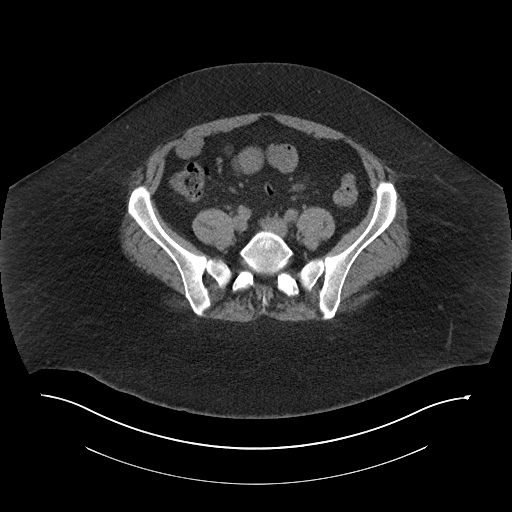
[im 46/101  soft-tissue]
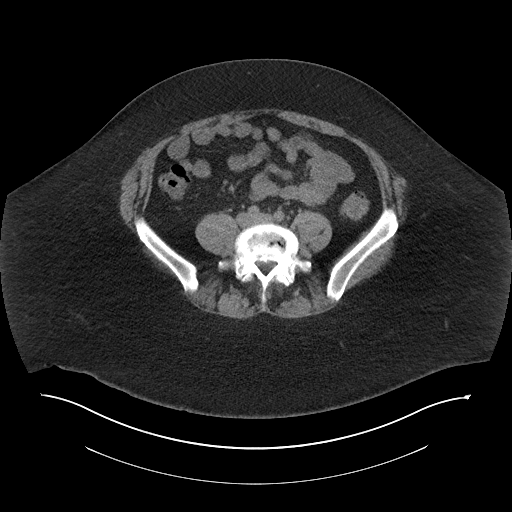
[im 55/101  soft-tissue]
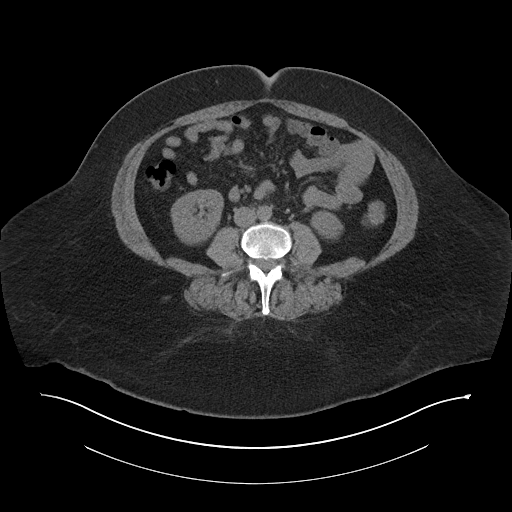
[im 59/101  soft-tissue]
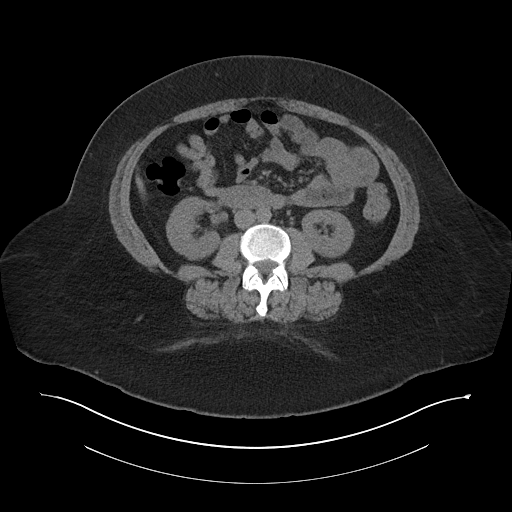
[im 59/101  bone]
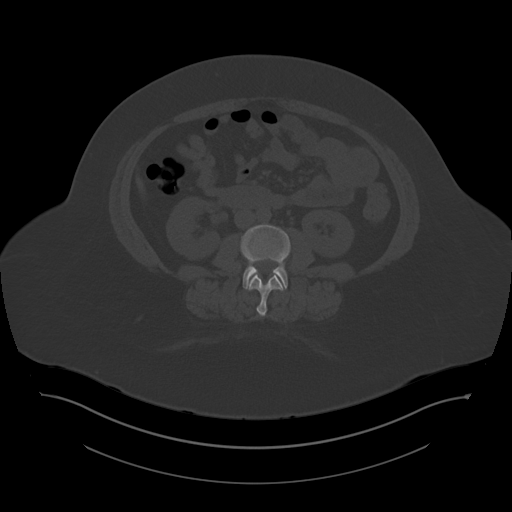
[im 67/101  soft-tissue]
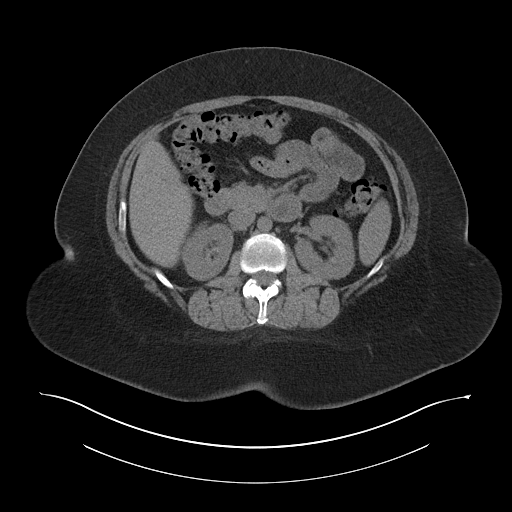
[im 76/101  soft-tissue]
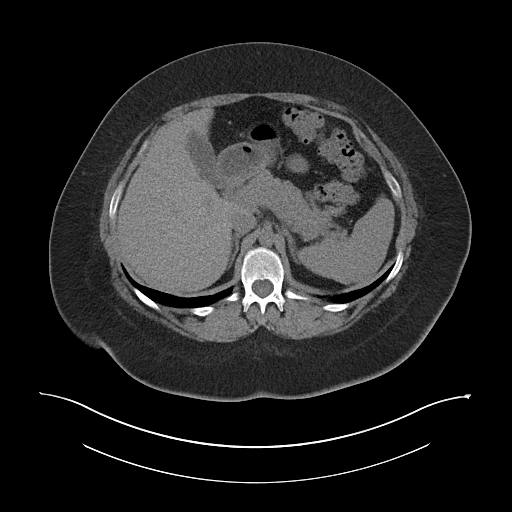
[im 80/101  soft-tissue]
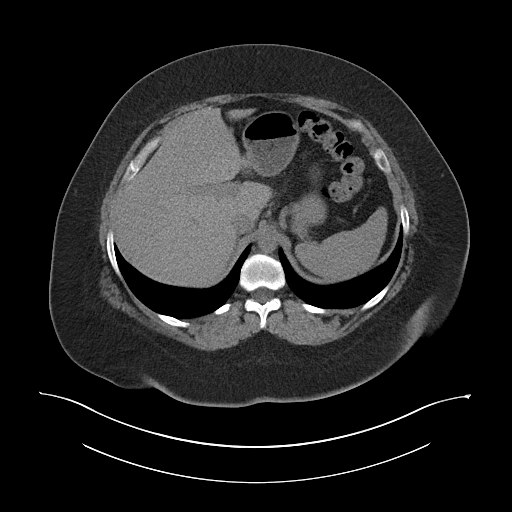
[im 88/101  soft-tissue]
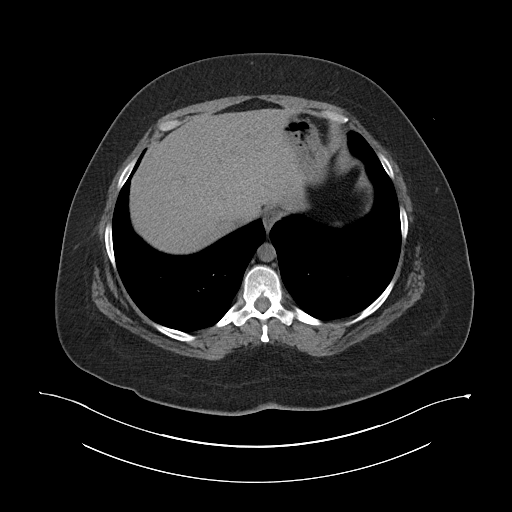
[im 96/101  soft-tissue]
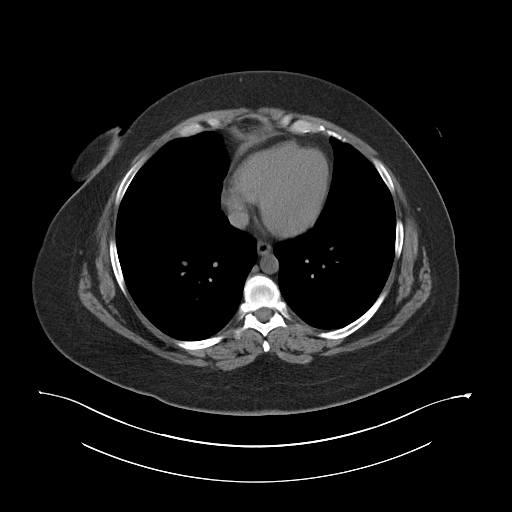

[Series 5: renal stone 3.0 cor · coronal · 0.98mm/px · 3 of 132 slices shown]
[im 44/132  soft-tissue]
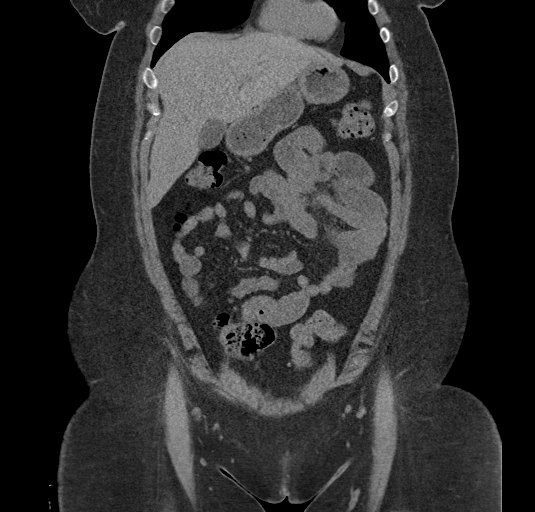
[im 59/132  soft-tissue]
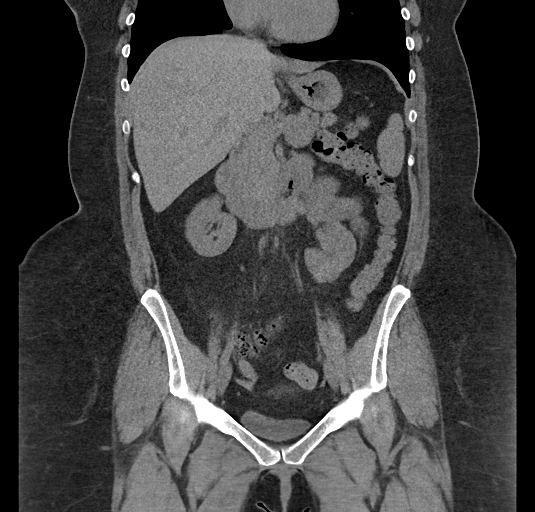
[im 73/132  soft-tissue]
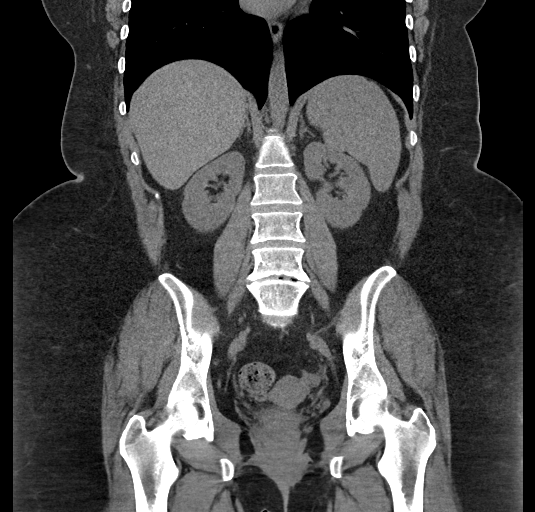

[17 of 46 positions shown; findings below may reference images not displayed]

FINDINGS: Lower chest: Visualized lung bases are clear.

Hepatobiliary: Liver demonstrates a normal unenhanced appearance.
Gallbladder within normal limits. No biliary dilatation.

Pancreas: Pancreas within normal limits.

Spleen: Unremarkable.

Adrenals/Urinary Tract: Adrenal glands are normal. Kidneys equal in
size without hydronephrosis. Punctate nonobstructive nephrolithiasis
seen bilaterally. No radiopaque calculi seen along the course of
either renal collecting system. No hydroureter. Partially distended
bladder demonstrates no acute finding. No layering stones seen
within the bladder lumen.

Stomach/Bowel: Stomach within normal limits. No evidence for bowel
obstruction. No acute inflammatory changes seen about the bowels.

Vascular/Lymphatic: Intra-abdominal aorta of normal caliber. No
adenopathy.

Reproductive: Uterus and ovaries within normal limits.

Other: No free air or fluid.

Musculoskeletal: No acute osseous abnormality. No worrisome lytic or
blastic osseous lesions. Degenerative spondylolysis noted at L4-5.
IMPRESSION: 1. Punctate nonobstructive bilateral nephrolithiasis. No
ureterolithiasis or evidence for obstructive uropathy.
2. No other acute intra-abdominal or pelvic process.

## 2020-04-24 ENCOUNTER — Emergency Department (HOSPITAL_COMMUNITY)
Admission: EM | Admit: 2020-04-24 | Discharge: 2020-04-25 | Disposition: A | Discharge status: Home / Self Care | Payer: BLUE CROSS/BLUE SHIELD | Attending: Emergency Medicine | Admitting: Emergency Medicine

## 2020-04-24 ENCOUNTER — Other Ambulatory Visit: Payer: Self-pay

## 2020-04-24 ENCOUNTER — Encounter (HOSPITAL_COMMUNITY): Payer: Self-pay | Admitting: Emergency Medicine

## 2020-04-24 DIAGNOSIS — R2243 Localized swelling, mass and lump, lower limb, bilateral: Secondary | ICD-10-CM | POA: Insufficient documentation

## 2020-04-24 DIAGNOSIS — R1032 Left lower quadrant pain: Secondary | ICD-10-CM | POA: Diagnosis not present

## 2020-04-24 DIAGNOSIS — R109 Unspecified abdominal pain: Secondary | ICD-10-CM

## 2020-04-24 DIAGNOSIS — E669 Obesity, unspecified: Secondary | ICD-10-CM | POA: Diagnosis not present

## 2020-04-24 DIAGNOSIS — R110 Nausea: Secondary | ICD-10-CM | POA: Diagnosis not present

## 2020-04-24 DIAGNOSIS — R61 Generalized hyperhidrosis: Secondary | ICD-10-CM | POA: Diagnosis not present

## 2020-04-24 LAB — CBC
HCT: 41.2 % (ref 36.0–46.0)
Hemoglobin: 12 g/dL (ref 12.0–15.0)
MCH: 24 pg — ABNORMAL LOW (ref 26.0–34.0)
MCHC: 29.1 g/dL — ABNORMAL LOW (ref 30.0–36.0)
MCV: 82.4 fL (ref 80.0–100.0)
Platelets: 351 10*3/uL (ref 150–400)
RBC: 5 MIL/uL (ref 3.87–5.11)
RDW: 15.4 % (ref 11.5–15.5)
WBC: 7.5 10*3/uL (ref 4.0–10.5)
nRBC: 0 % (ref 0.0–0.2)

## 2020-04-24 LAB — URINALYSIS, ROUTINE W REFLEX MICROSCOPIC
Bilirubin Urine: NEGATIVE
Glucose, UA: NEGATIVE mg/dL
Ketones, ur: NEGATIVE mg/dL
Leukocytes,Ua: NEGATIVE
Nitrite: NEGATIVE
Protein, ur: NEGATIVE mg/dL
RBC / HPF: >50 RBC/hpf — ABNORMAL HIGH (ref 0–5)
Specific Gravity, Urine: 1.024 (ref 1.005–1.030)
pH: 7 (ref 5.0–8.0)

## 2020-04-24 LAB — BASIC METABOLIC PANEL
Anion gap: 8 (ref 5–15)
BUN: 7 mg/dL (ref 6–20)
CO2: 25 mmol/L (ref 22–32)
Calcium: 9.3 mg/dL (ref 8.9–10.3)
Chloride: 106 mmol/L (ref 98–111)
Creatinine, Ser: 0.71 mg/dL (ref 0.44–1.00)
GFR calc Af Amer: >60 mL/min (ref >60)
GFR calc non Af Amer: >60 mL/min (ref >60)
Glucose, Bld: 112 mg/dL — ABNORMAL HIGH (ref 70–99)
Potassium: 4.6 mmol/L (ref 3.5–5.1)
Sodium: 139 mmol/L (ref 135–145)

## 2020-04-24 LAB — I-STAT BETA HCG BLOOD, ED (MC, WL, AP ONLY): I-stat hCG, quantitative: <5 m[IU]/mL (ref <5)

## 2020-04-24 MED ORDER — OXYCODONE-ACETAMINOPHEN 5-325 MG PO TABS
1.0000 | ORAL_TABLET | ORAL | Status: DC | PRN
  Administered 2020-04-24: 1 via ORAL
  Filled 2020-04-24: qty 1

## 2020-04-24 NOTE — ED Triage Notes (Signed)
Pt reports left flank pain that started about 3 hours ago feels similar to kidney stone she has had before. Pt states she went to [facility] and had brown colored urine.

## 2020-04-25 ENCOUNTER — Emergency Department (HOSPITAL_COMMUNITY): Payer: BLUE CROSS/BLUE SHIELD

## 2020-04-25 MED ORDER — OXYCODONE-ACETAMINOPHEN 5-325 MG PO TABS
1.0000 | ORAL_TABLET | Freq: Once | ORAL | Status: AC
  Administered 2020-04-25: 1 via ORAL
  Filled 2020-04-25: qty 1

## 2020-04-25 MED ORDER — ONDANSETRON HCL 4 MG/2ML IJ SOLN
4.0000 mg | Freq: Once | INTRAMUSCULAR | Status: AC
  Administered 2020-04-25: 4 mg via INTRAVENOUS
  Filled 2020-04-25: qty 2

## 2020-04-25 MED ORDER — SODIUM CHLORIDE 0.9 % IV BOLUS
1000.0000 mL | Freq: Once | INTRAVENOUS | Status: AC
  Administered 2020-04-25: 1000 mL via INTRAVENOUS

## 2020-04-25 MED ORDER — TAMSULOSIN HCL 0.4 MG PO CAPS: 0.4000 mg | ORAL_CAPSULE | Freq: Every day | ORAL | 0 refills | Status: AC

## 2020-04-25 MED ORDER — HYDROCODONE-ACETAMINOPHEN 5-325 MG PO TABS: 1.0000 | ORAL_TABLET | Freq: Four times a day (QID) | ORAL | 0 refills | Status: AC | PRN

## 2020-04-25 MED ORDER — ONDANSETRON HCL 4 MG PO TABS: 4.0000 mg | ORAL_TABLET | Freq: Four times a day (QID) | ORAL | 0 refills | Status: AC

## 2020-04-25 MED ORDER — MORPHINE SULFATE (PF) 4 MG/ML IV SOLN
4.0000 mg | Freq: Once | INTRAVENOUS | Status: AC
  Administered 2020-04-25: 4 mg via INTRAVENOUS
  Filled 2020-04-25: qty 1

## 2020-04-25 NOTE — ED Notes (Signed)
Patient transported to CT scan . 

## 2020-04-25 NOTE — ED Provider Notes (Signed)
[facility] EMERGENCY DEPARTMENT Provider Note   CSN: [id number] Arrival date & time: 04/24/20  1416     History Chief Complaint  Patient presents with  . Flank Pain    Kayla Cabrera is a 39 y.o. female.  39 y.o female with a PMH of kidney stones presents to the ED with chief complaint of left flank pain which began around noon today.  Describes an intermittent sharp pain originating in her left flank and radiation to her left lower quadrant.  Similar episodes in the past of kidney stones, was told last time during a CT in July that she had stones in her kidney however they had not exited.  She is followed by urology with a group in [place].  Reports she was given Percocet while in the waiting room which helped relieve some of the pain.  She also endorses some brown discolored urine which has been happening since she has voided while in the ED.  She still endorses nausea but has not had any vomiting.  Denies any fever, vaginal discharge, vaginal bleeding, shortness of breath. Last menstrual period approximately 2 weeks ago.  The history is provided by the patient.  Flank Pain Associated symptoms include abdominal pain. Pertinent negatives include no chest pain and no shortness of breath.       Past Medical History:  Diagnosis Date  . Kidney stone   . Kidney stones   . Obesity     There are no problems to display for this patient.   No past surgical history on file.   OB History   No obstetric history on file.     No family history on file.  Social History   Tobacco Use  . Smoking status: Never Smoker  . Smokeless tobacco: Never Used  Substance Use Topics  . Alcohol use: Yes    Comment: occ  . Drug use: No    Home Medications Prior to Admission medications   Medication Sig Start Date End Date Taking? Authorizing Provider  pantoprazole (PROTONIX) 40 MG tablet Take 40 mg by mouth daily. 01/30/20  Yes [provider]    HYDROcodone-acetaminophen (NORCO/VICODIN) 5-325 MG tablet Take 1 tablet by mouth every 6 (six) hours as needed for up to 5 days for moderate pain. 04/25/20 04/30/20  Claude Manges, PA-C  ibuprofen (ADVIL,MOTRIN) 800 MG tablet Take 1 tablet (800 mg total) by mouth every 8 (eight) hours as needed. Patient not taking: Reported on 04/25/2020 08/16/18   Long, Arlyss Repress, MD  methocarbamol (ROBAXIN) 500 MG tablet Take 1 tablet (500 mg total) by mouth 2 (two) times daily as needed (muscle soreness). Patient not taking: Reported on 08/16/2018 12/25/17   Ward, Chase Picket, PA-C  naproxen (NAPROSYN) 500 MG tablet Take 1 tablet (500 mg total) by mouth 2 (two) times daily as needed. Patient not taking: Reported on 08/16/2018 12/25/17   Ward, Chase Picket, PA-C  ondansetron (ZOFRAN ODT) 4 MG disintegrating tablet Take 1 tablet (4 mg total) by mouth every 8 (eight) hours as needed for nausea or vomiting. Patient not taking: Reported on 04/25/2020 08/16/18   Long, Arlyss Repress, MD  ondansetron (ZOFRAN) 4 MG tablet Take 1 tablet (4 mg total) by mouth every 6 (six) hours for 5 days. 04/25/20 04/30/20  Claude Manges, PA-C  oxyCODONE-acetaminophen (PERCOCET/ROXICET) 5-325 MG tablet Take 1 tablet by mouth every 6 (six) hours as needed for severe pain. Patient not taking: Reported on 04/25/2020 08/16/18   Long, Arlyss Repress, MD  tamsulosin (  FLOMAX) 0.4 MG CAPS capsule Take 1 capsule (0.4 mg total) by mouth daily for 10 days. 04/25/20 05/05/20  Claude MangesSoto, Kailo Kosik, PA-C    Allergies    Nsaids  Review of Systems   Review of Systems  Constitutional: Negative for chills and fever.  HENT: Negative for sore throat.   Respiratory: Negative for shortness of breath.   Cardiovascular: Negative for chest pain.  Gastrointestinal: Positive for abdominal pain and nausea. Negative for vomiting.  Genitourinary: Positive for flank pain (LEFT).  Musculoskeletal: Negative for back pain.  Skin: Negative for pallor and wound.  Neurological: Negative for  light-headedness.  All other systems reviewed and are negative.   Physical Exam Updated Vital Signs BP 131/79 (BP Location: Left Arm)   Pulse 91   Temp 98.7 F (37.1 C) (Oral)   Resp 16   LMP 04/10/2020   SpO2 99%   Physical Exam Vitals and nursing note reviewed.  Constitutional:      Appearance: Normal appearance. She is obese. She is diaphoretic.  HENT:     Head: Normocephalic and atraumatic.     Mouth/Throat:     Mouth: Mucous membranes are moist.  Cardiovascular:     Rate and Rhythm: Normal rate.  Pulmonary:     Effort: Pulmonary effort is normal.     Breath sounds: No wheezing or rales.  Abdominal:     General: Abdomen is flat.     Palpations: Abdomen is soft.     Tenderness: There is abdominal tenderness in the left lower quadrant. There is left CVA tenderness. There is no right CVA tenderness.     Hernia: No hernia is present.  Musculoskeletal:     Cervical back: Normal range of motion and neck supple.  Skin:    General: Skin is warm.  Neurological:     Mental Status: She is alert and oriented to person, place, and time.     ED Results / Procedures / Treatments   Labs (all labs ordered are listed, but only abnormal results are displayed) Labs Reviewed  URINALYSIS, ROUTINE W REFLEX MICROSCOPIC - Abnormal; Notable for the following components:      Result Value   APPearance HAZY (*)    Hgb urine dipstick LARGE (*)    RBC / HPF >50 (*)    Bacteria, UA RARE (*)    All other components within normal limits  BASIC METABOLIC PANEL - Abnormal; Notable for the following components:   Glucose, Bld 112 (*)    All other components within normal limits  CBC - Abnormal; Notable for the following components:   MCH 24.0 (*)    MCHC 29.1 (*)    All other components within normal limits  HEPATIC FUNCTION PANEL  LIPASE, BLOOD  I-STAT BETA HCG BLOOD, ED ([facility], [facility], [facility] ONLY)    EKG None  Radiology CT Renal Stone Study  Result Date: 04/25/2020 CLINICAL DATA:  Flank  pain EXAM: CT ABDOMEN AND PELVIS WITHOUT CONTRAST TECHNIQUE: Multidetector CT imaging of the abdomen and pelvis was performed following the standard protocol without IV contrast. COMPARISON:  CT 08/16/2018 FINDINGS: Lower chest: No acute abnormality. Hepatobiliary: No focal liver abnormality is seen. No gallstones, gallbladder wall thickening, or biliary dilatation. Pancreas: Unremarkable. No pancreatic ductal dilatation or surrounding inflammatory changes. Spleen: Normal in size without focal abnormality. Adrenals/Urinary Tract: Adrenal glands are normal. Kidneys show no hydronephrosis. Punctate stones in the bilateral kidneys. The bladder is normal Stomach/Bowel: Stomach is within normal limits. Appendix appears normal. No evidence of  bowel wall thickening, distention, or inflammatory changes. Vascular/Lymphatic: No significant vascular findings are present. No enlarged abdominal or pelvic lymph nodes. Reproductive: Uterus and bilateral adnexa are unremarkable. Other: No abdominal wall hernia or abnormality. No abdominopelvic ascites. Musculoskeletal: No acute or significant osseous findings. IMPRESSION: 1. Negative for hydronephrosis or ureteral stone. Punctate stones in the bilateral kidneys. 2. No CT evidence for acute intra-abdominal or pelvic abnormality. Electronically Signed   By: Jasmine Pang M.D.   On: 04/25/2020 03:37    Procedures Procedures (including critical care time)  Medications Ordered in ED Medications  oxyCODONE-acetaminophen (PERCOCET/ROXICET) 5-325 MG per tablet 1 tablet (1 tablet Oral Given 04/24/20 1459)  morphine 4 MG/ML injection 4 mg (4 mg Intravenous Given 04/25/20 0122)  sodium chloride 0.9 % bolus 1,000 mL (0 mLs Intravenous Stopped 04/25/20 0226)  ondansetron (ZOFRAN) injection 4 mg (4 mg Intravenous Given 04/25/20 0122)  oxyCODONE-acetaminophen (PERCOCET/ROXICET) 5-325 MG per tablet 1 tablet (1 tablet Oral Given 04/25/20 8676)    ED Course  I have reviewed the triage vital  signs and the nursing notes.  Pertinent labs & imaging results that were available during my care of the patient were reviewed by me and considered in my medical decision making (see chart for details).    MDM Rules/Calculators/A&P   Patient the past medical history of kidney stones presents to the ED with a chief complaint of left flank pain which began around noon today, has had a similar episode in the past, last CT renal was obtained earlier in July which noted for nephrolithiasis.  She does have a urology group that she is followed by in [place].  Was given a Percocet while in the ED with some improvement in her symptoms.  Does endorse some nausea but no vomiting episodes.  During primary evaluation patient is diaphoretic, appears very uncomfortable, does have mild left-sided tenderness with radiation down to the left lower quadrant.  Abdomen is soft, mildly tender to palpation but bowel sounds are present.  No constipation, diarrhea, bowel movements are normal per patient.  Last menstrual period approximately 2 weeks ago.  Has noted some brown urine while voiding during her ED stay.  Vital signs are remarkable for hypertension, slight elevation in heart rate suspect this is likely due to pain.  Lungs are clear to auscultation she does not have any shortness of breath, no wheezing on my exam.  Minimal swelling noted to bilateral legs, does report that with prior kidney stones she is had some swelling to bilateral lower extremities.  Differential diagnosis included but not limited to kidney stone versus diverticulitis versus gastroenteritis.  Patient does have a allergy to Toradol, reports swelling to her throat, will be bypassing this medication at this time.  She was given morphine 4 mg along with Zofran to help with symptoms.  To rotation of her labs revealed a BMP without any electrolyte derangement, her kidney function is unremarkable.  CBC without any leukocytosis.  Beta hCG is normal.  UA  with large amount of hemoglobin, rare bacteria.  LFTs have been added.  We discussed repetition of CT renal at this time in order to further evaluate symptoms.  Patient is agreeable of this.  CT Renal showed:  1. Negative for hydronephrosis or ureteral stone. Punctate stones in the bilateral kidneys. 2. No CT evidence for acute intra-abdominal or pelvic abnormality.  5:25 AM these results were discussed with patient at length, we discussed further evaluating lower abdominal pain with ultrasound, patient reports "this pain feels very  similar to my prior episodes of kidney stones ".  Discussed further evaluating torsion, patient reports "I am here in town for my cousin's wedding, I just need some pain medication to keep symptoms at bay".  We discussed Flomax, she does report having this medication in [place] but would like a new prescription.  I have reviewed the PDMP, prior prescription for Percocet for the both of July but only for 8 tablets.  Patient will be discharged home with Zofran, Percocet, Flomax.  Patient understands and agrees with management, given one Percocet prior to disposition.  Patient stable for discharge.   Portions of this note were generated with Scientist, clinical (histocompatibility and immunogenetics). Dictation errors may occur despite best attempts at proofreading.  Final Clinical Impression(s) / ED Diagnoses Final diagnoses:  Left flank pain  Nausea    Rx / DC Orders ED Discharge Orders         Ordered    HYDROcodone-acetaminophen (NORCO/VICODIN) 5-325 MG tablet  Every 6 hours PRN     Discontinue  Reprint     04/25/20 0500    ondansetron (ZOFRAN) 4 MG tablet  Every 6 hours     Discontinue  Reprint     04/25/20 0500    tamsulosin (FLOMAX) 0.4 MG CAPS capsule  Daily     Discontinue  Reprint     04/25/20 0500           Claude Manges, PA-C 04/25/20 2505    Glynn Octave, MD 04/25/20 [phone]

## 2020-04-25 NOTE — Discharge Instructions (Addendum)
Your CT today did not show any kidney stone.  Your laboratory results are within normal limits.  I have prescribed medication to help with pain control, please take this medication as needed.  Please be aware this medication can cause drowsiness, do not drink alcohol or drive while taking this medication.  I have also prescribed Zofran to help with nausea.  Please follow-up with your urologist in [place] for further management of your recurrent kidney stones.

## 2020-05-07 IMAGING — US US TRANSVAGINAL NON-OB
2 series · 13 of 25 positions shown · non-contrast
Comparison: Prior CT from 08/28/2016

CLINICAL DATA: Initial evaluation for acute left lower quadrant
abdominal pain.

EXAM:
TRANSABDOMINAL AND TRANSVAGINAL ULTRASOUND OF PELVIS
DOPPLER ULTRASOUND OF OVARIES
TECHNIQUE: Both transabdominal and transvaginal ultrasound examinations of the
pelvis were performed. Transabdominal technique was performed for
global imaging of the pelvis including uterus, ovaries, adnexal
regions, and pelvic cul-de-sac.
It was necessary to proceed with endovaginal exam following the
transabdominal exam to visualize the uterus, endometrium, and
ovaries. Color and duplex Doppler ultrasound was utilized to
evaluate blood flow to the ovaries.

[Series 1: us transvaginal non-ob · 0.16mm/px · 7 of 37 slices shown (1 of 2)]
[im 1/37]
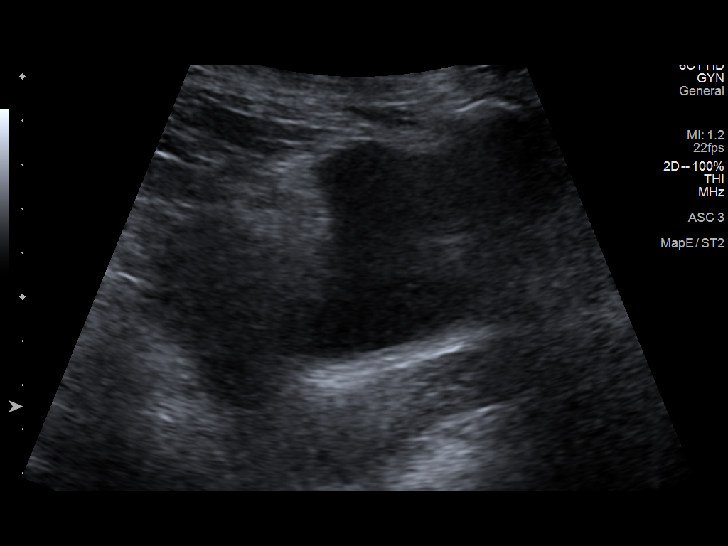
[im 7/37]
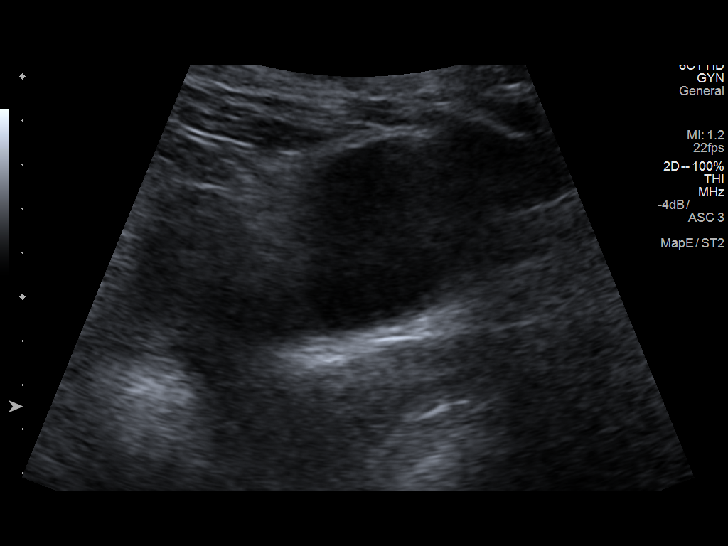
[im 13/37]
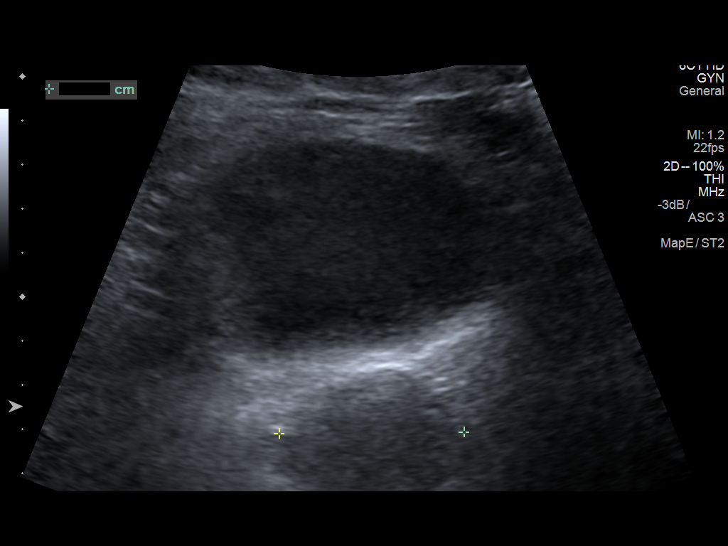
[im 19/37]
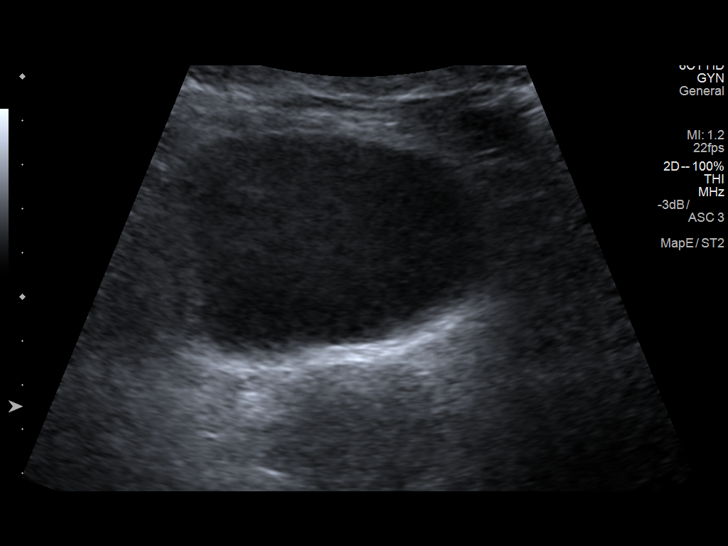
[im 25/37]
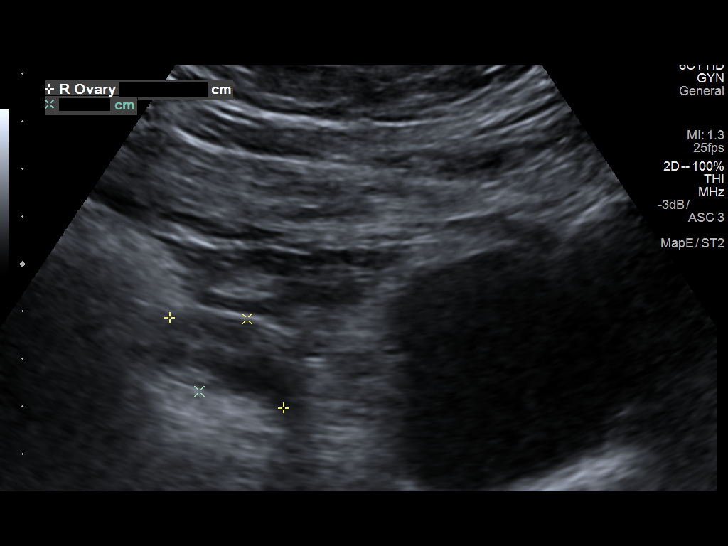
[im 31/37]
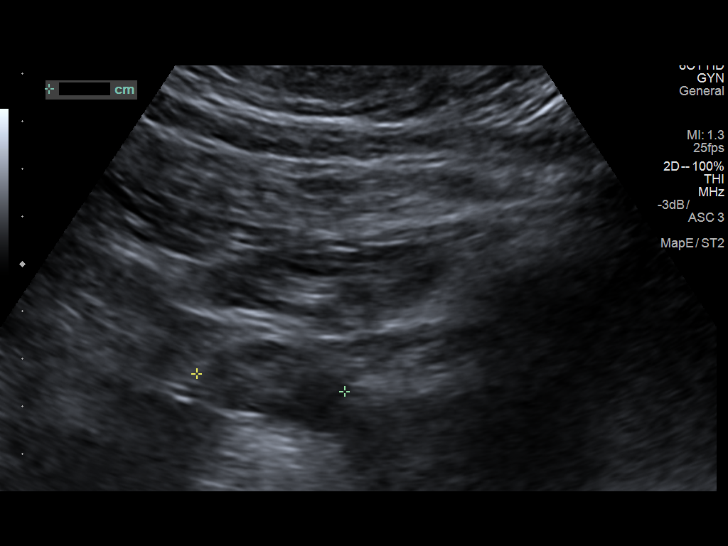
[im 37/37]
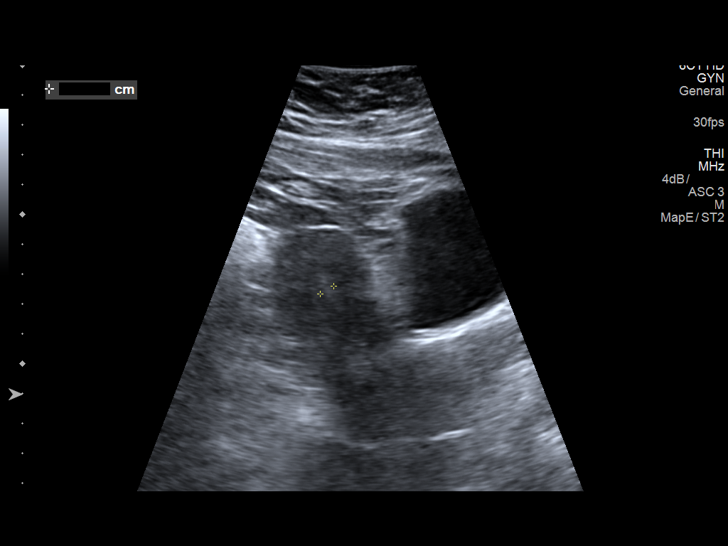

[Series 1: us transvaginal non-ob · 0.12mm/px · 6 of 32 slices shown (2 of 2)]
[im 3/32]
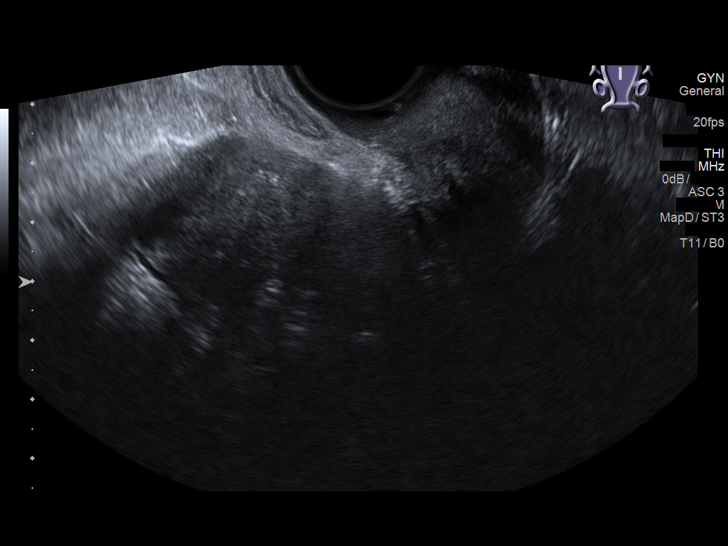
[im 9/32]
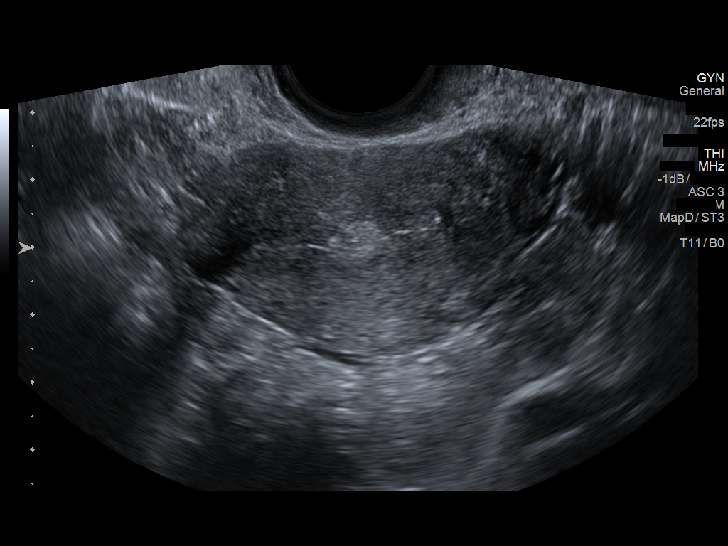
[im 15/32]
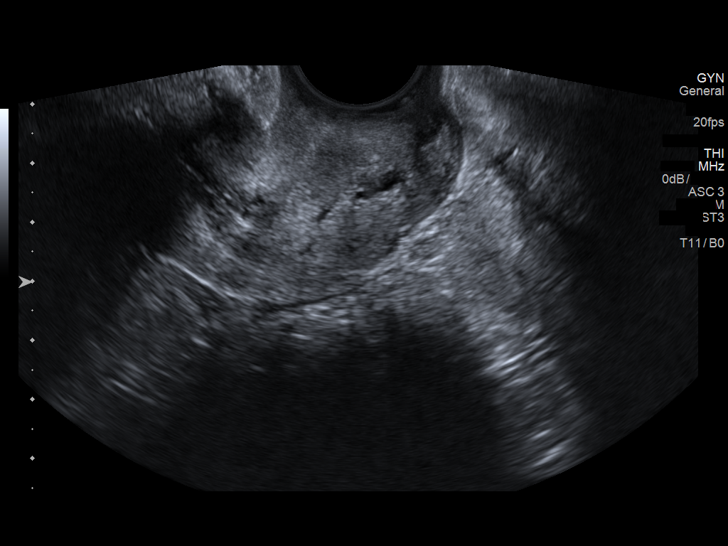
[im 20/32]
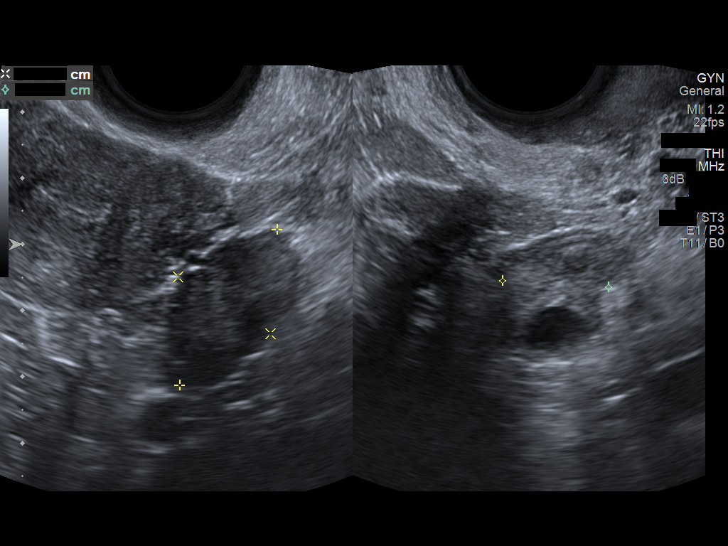
[im 26/32]
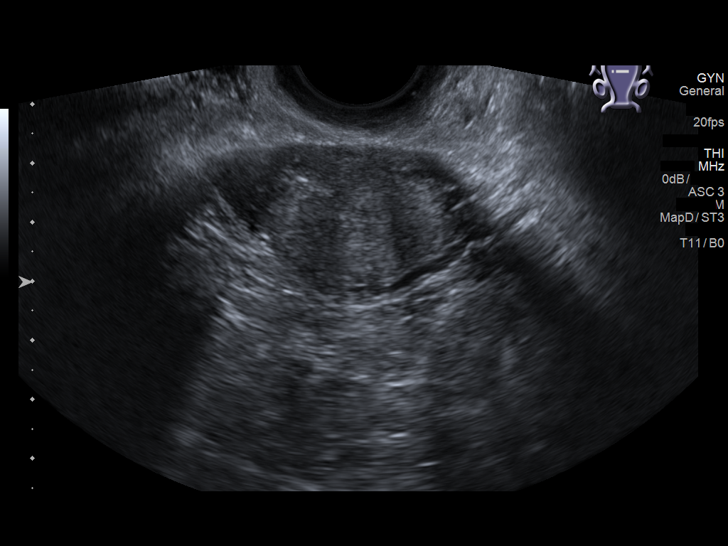
[im 32/32]
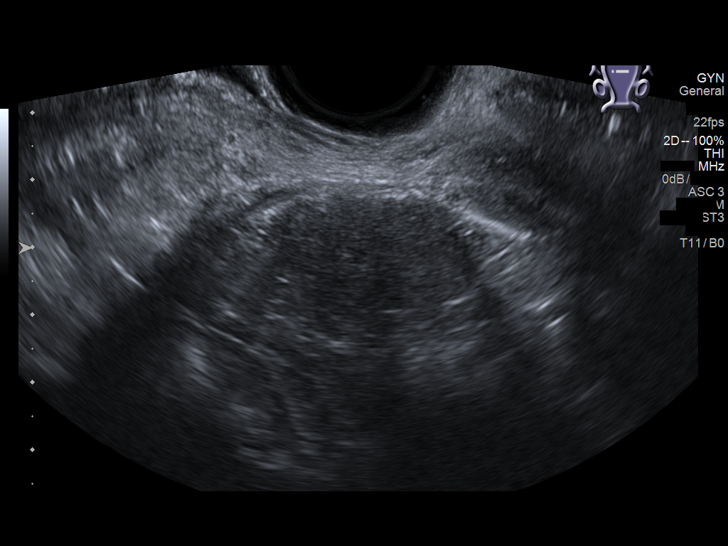

[13 of 25 positions shown; findings below may reference images not displayed]

FINDINGS: Uterus

Measurements: 7.2 x 3.7 x 4.8 cm. No fibroids or other mass
visualized.

Endometrium

Thickness: 3 mm. Focal echogenic lesion measuring 1.1 x 0.6 x 0.9 cm
with suggestion of vascular skull stalk, suggestive of a small
polyp.

Right ovary

Measurements: 2.9 x 2.6 x 1.9 cm. Normal appearance/no adnexal mass.

Left ovary

Measurements: 2.8 x 1.6 x 1.6 cm. Normal appearance/no adnexal mass.

Pulsed Doppler evaluation of the right ovary demonstrates normal
low-resistance arterial and venous waveforms. Doppler interrogation
of the left ovary was unable to be performed due to patient
discomfort. While Doppler was not able to be performed, the left
ovary itself is normal in appearance on grayscale imaging with no
secondary signs to suggest torsion.

Other findings

Trace free physiologic fluid within the pelvis.
IMPRESSION: 1. 1.1 cm echogenic lesion within the endometrial cavity with
suggestion of associated vascular stalk, most suggestive of a
possible endometrial polyp. Consider follow-up examination with
nonemergent sonohysterogram for further evaluation, prior to
hysteroscopy or endometrial biopsy.
2. No other acute abnormality within the pelvis. Please note that
Doppler interrogation of the left ovary was unable to be performed
due to patient discomfort. Although Doppler was not performed, the
left ovary itself is normal in appearance on grayscale imaging with
no secondary signs to suggest torsion. If there is high clinical
suspicion for possible occult torsion, a re-attempt at Doppler
interrogation could be made as clinically warranted and as the
patient can tolerate. Right ovary is normal in appearance without
evidence for torsion.
3. Otherwise unremarkable and normal pelvic ultrasound.

## 2020-05-07 IMAGING — US US ART/VEN ABD/PELV/SCROTUM DOPPLER LTD
1 series · 14 of 17 positions shown · non-contrast
Comparison: None.

CLINICAL DATA: Left lower quadrant pain

EXAM:
DOPPLER ULTRASOUND OF LEFT OVARY
TECHNIQUE: Color and duplex Doppler ultrasound was utilized to evaluate blood
flow to the left ovary

[Series 1: us art/ven abd/pelv/scrotum doppler ltd · 0.11mm/px · 17 acquisitions, 14 frames shown]
[im 1/17]
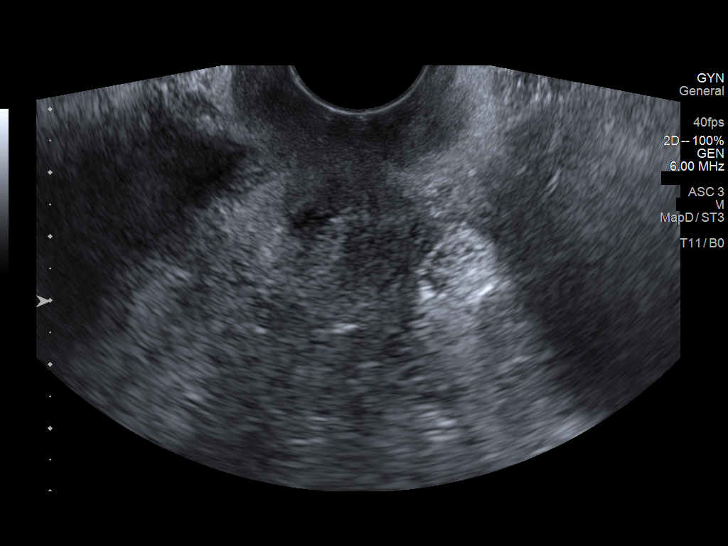
[im 2/17]
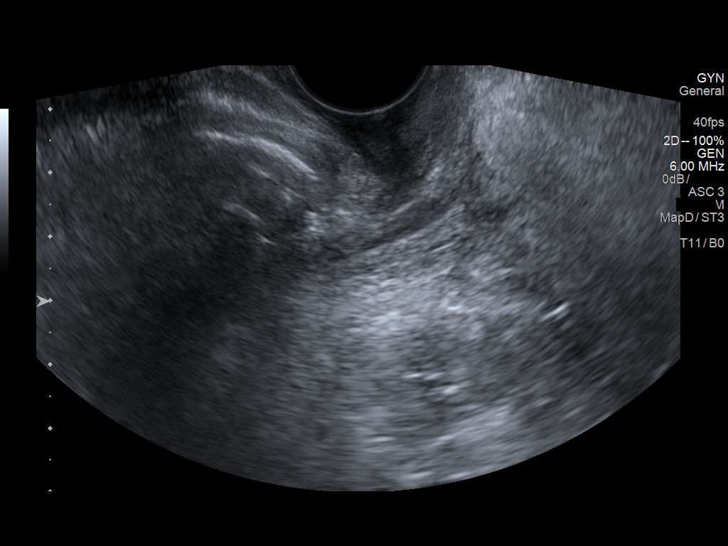
[im 4/17]
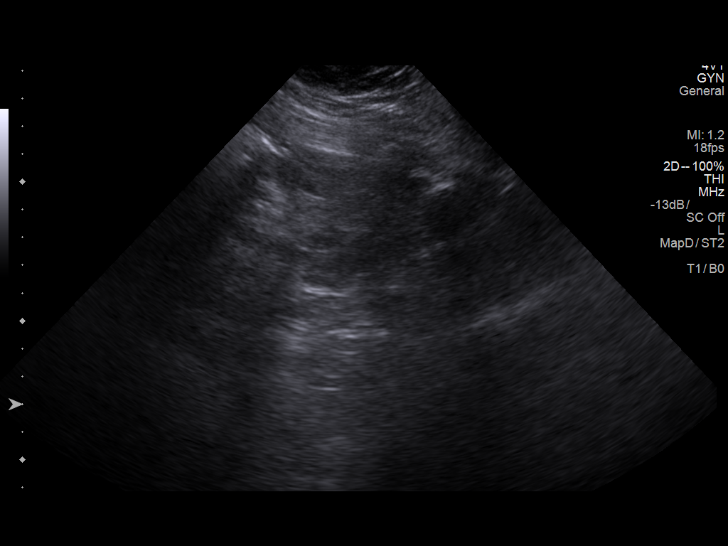
[im 5/17]
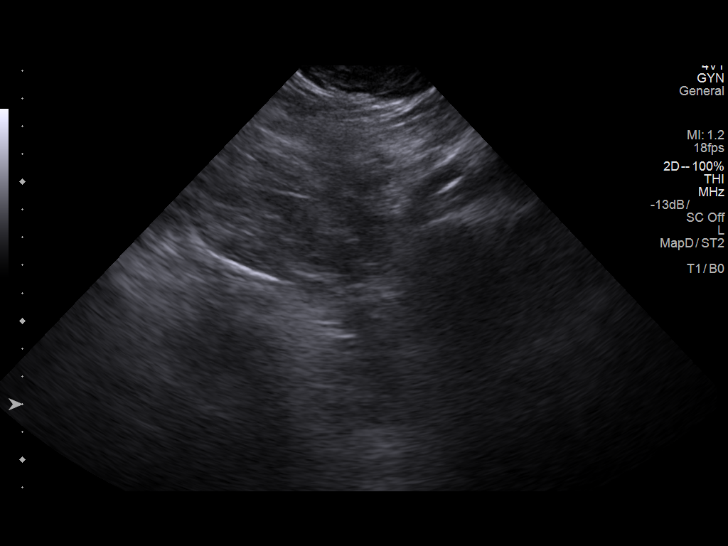
[im 6/17]
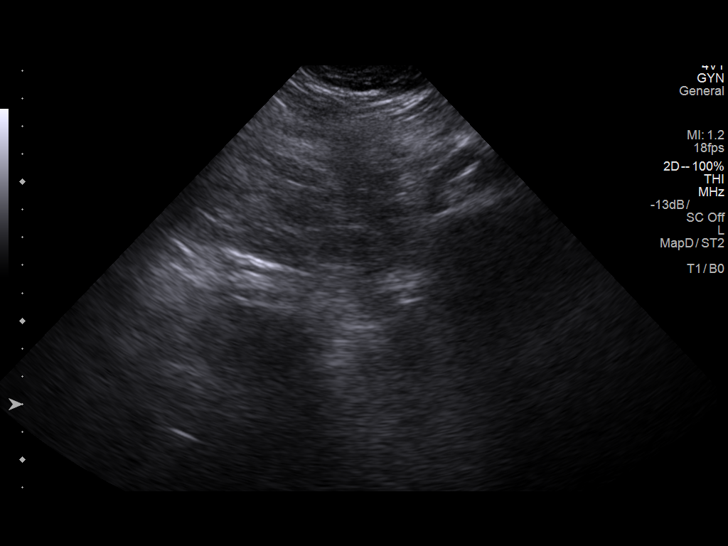
[im 7/17]
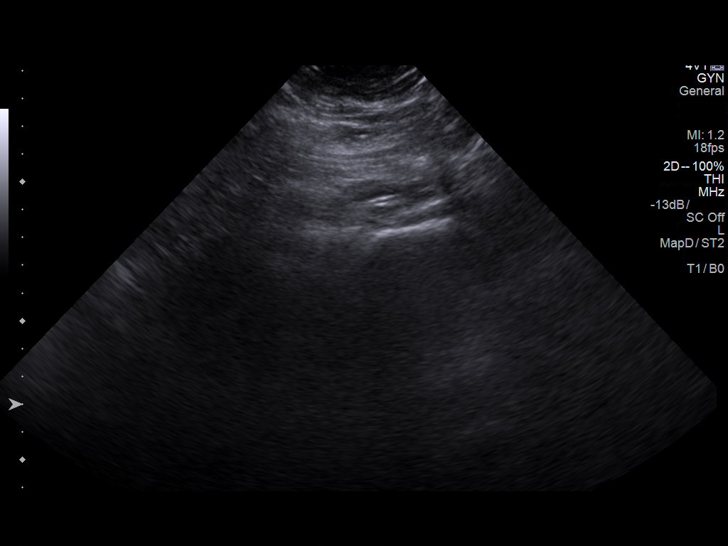
[im 8/17]
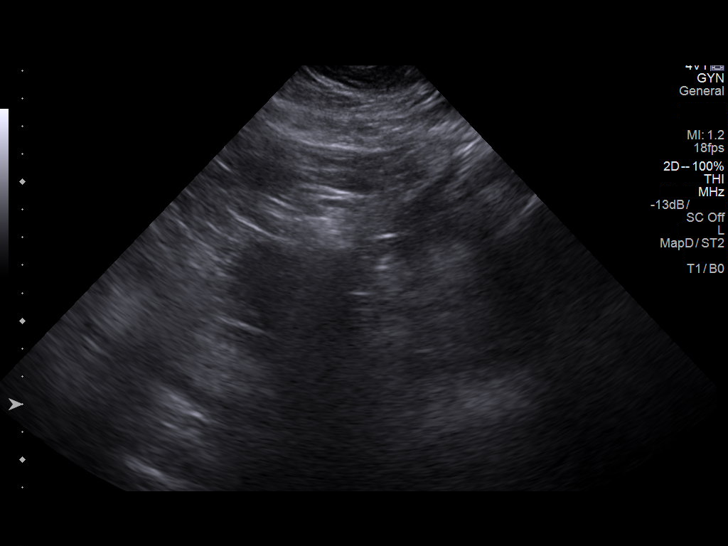
[im 10/17]
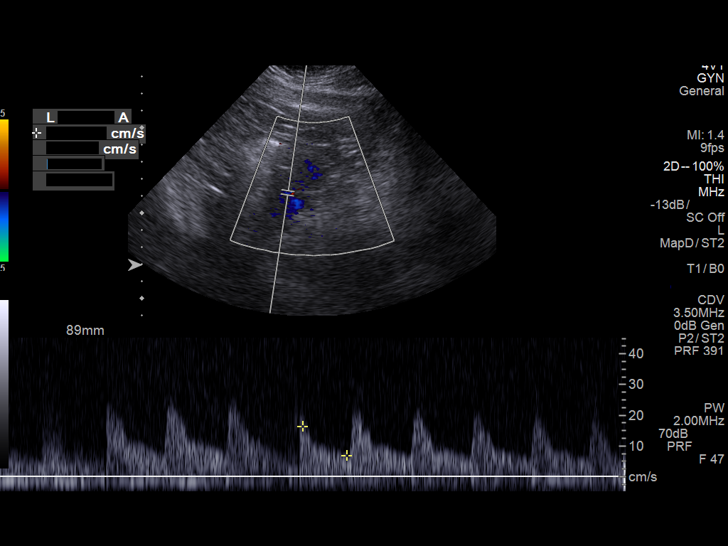
[im 11/17]
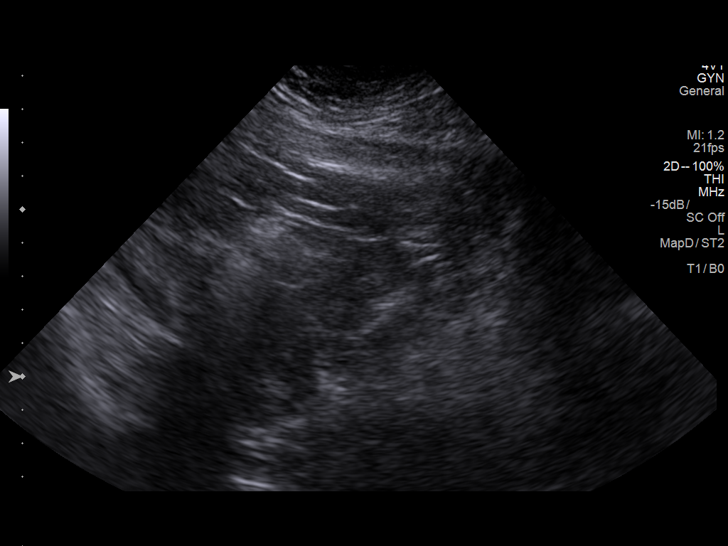
[im 12/17]
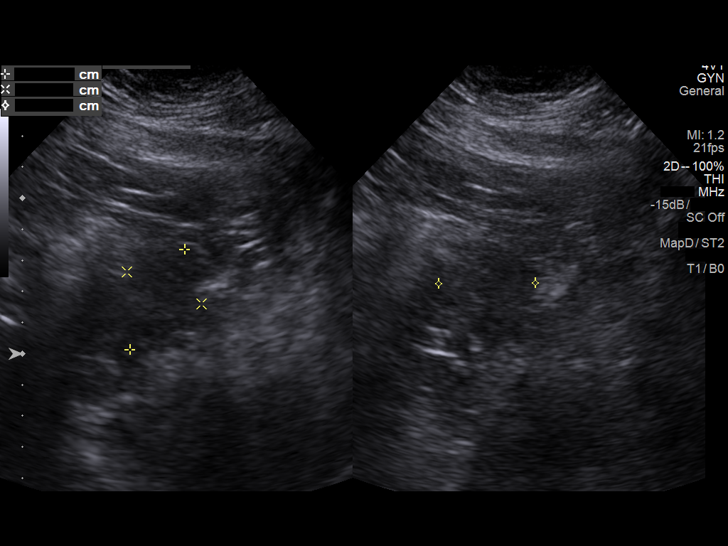
[im 13/17]
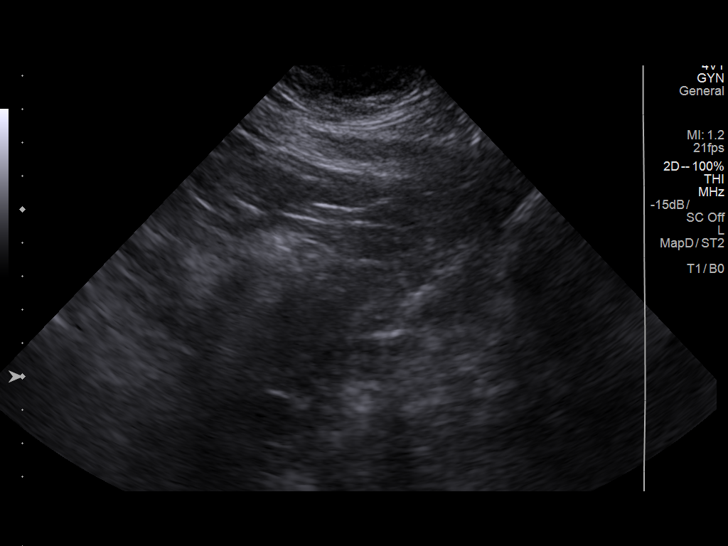
[im 14/17]
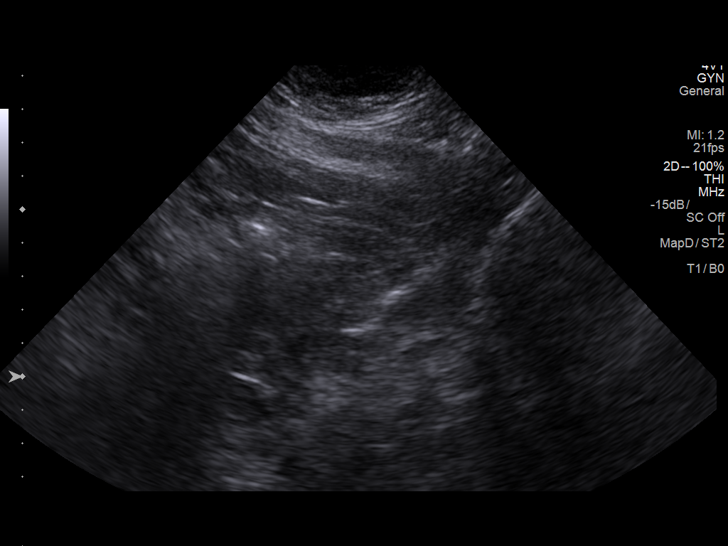
[im 16/17]
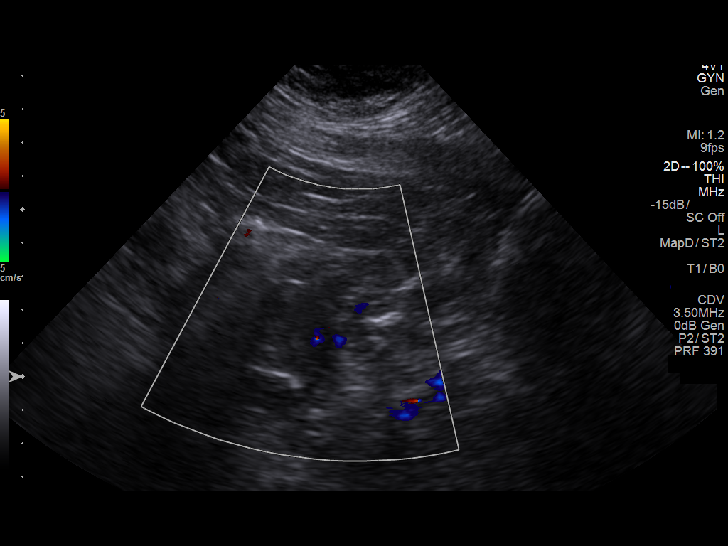
[im 17/17]
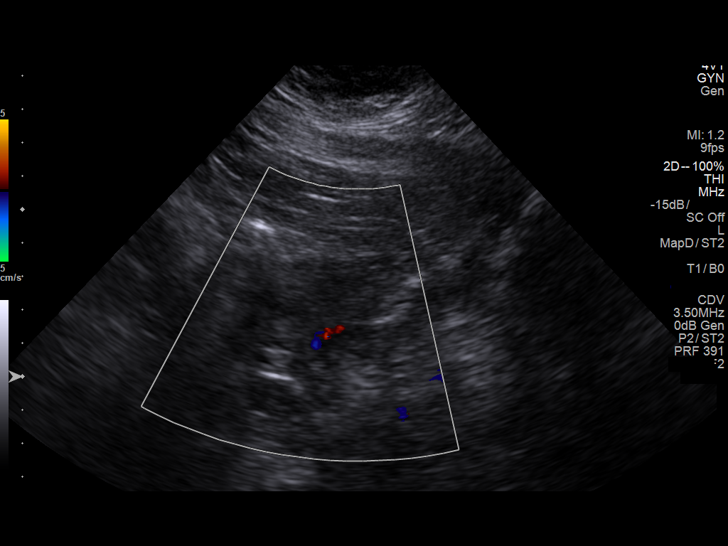

[14 of 17 positions shown; findings below may reference images not displayed]

FINDINGS: Pulsed Doppler evaluation of the left ovary demonstrates normal
low-resistance arterial and venous waveforms.
IMPRESSION: Normal Doppler examination of the left ovary.

## 2020-07-05 ENCOUNTER — Emergency Department (HOSPITAL_COMMUNITY): Payer: BLUE CROSS/BLUE SHIELD

## 2020-07-05 ENCOUNTER — Emergency Department (HOSPITAL_COMMUNITY)
Admission: EM | Admit: 2020-07-05 | Discharge: 2020-07-05 | Disposition: A | Discharge status: Home / Self Care | Payer: BLUE CROSS/BLUE SHIELD | Attending: Emergency Medicine | Admitting: Emergency Medicine

## 2020-07-05 DIAGNOSIS — R110 Nausea: Secondary | ICD-10-CM | POA: Insufficient documentation

## 2020-07-05 DIAGNOSIS — R310 Gross hematuria: Secondary | ICD-10-CM | POA: Insufficient documentation

## 2020-07-05 DIAGNOSIS — R109 Unspecified abdominal pain: Secondary | ICD-10-CM

## 2020-07-05 LAB — URINALYSIS, ROUTINE W REFLEX MICROSCOPIC
Bacteria, UA: NONE SEEN
Bilirubin Urine: NEGATIVE
Glucose, UA: NEGATIVE mg/dL
Ketones, ur: NEGATIVE mg/dL
Leukocytes,Ua: NEGATIVE
Nitrite: NEGATIVE
Protein, ur: NEGATIVE mg/dL
RBC / HPF: >50 RBC/hpf — ABNORMAL HIGH (ref 0–5)
Specific Gravity, Urine: 1.02 (ref 1.005–1.030)
pH: 6 (ref 5.0–8.0)

## 2020-07-05 LAB — I-STAT BETA HCG BLOOD, ED (MC, WL, AP ONLY): I-stat hCG, quantitative: <5 m[IU]/mL (ref <5)

## 2020-07-05 LAB — BASIC METABOLIC PANEL
Anion gap: 13 (ref 5–15)
BUN: 12 mg/dL (ref 6–20)
CO2: 20 mmol/L — ABNORMAL LOW (ref 22–32)
Calcium: 9.1 mg/dL (ref 8.9–10.3)
Chloride: 104 mmol/L (ref 98–111)
Creatinine, Ser: 0.7 mg/dL (ref 0.44–1.00)
GFR, Estimated: >60 mL/min (ref >60)
Glucose, Bld: 100 mg/dL — ABNORMAL HIGH (ref 70–99)
Potassium: 3.9 mmol/L (ref 3.5–5.1)
Sodium: 137 mmol/L (ref 135–145)

## 2020-07-05 LAB — CBC
HCT: 39.3 % (ref 36.0–46.0)
Hemoglobin: 11.5 g/dL — ABNORMAL LOW (ref 12.0–15.0)
MCH: 23.2 pg — ABNORMAL LOW (ref 26.0–34.0)
MCHC: 29.3 g/dL — ABNORMAL LOW (ref 30.0–36.0)
MCV: 79.2 fL — ABNORMAL LOW (ref 80.0–100.0)
Platelets: 441 10*3/uL — ABNORMAL HIGH (ref 150–400)
RBC: 4.96 MIL/uL (ref 3.87–5.11)
RDW: 15.4 % (ref 11.5–15.5)
WBC: 7.8 10*3/uL (ref 4.0–10.5)
nRBC: 0 % (ref 0.0–0.2)

## 2020-07-05 MED ORDER — ONDANSETRON 4 MG PO TBDP
4.0000 mg | ORAL_TABLET | Freq: Once | ORAL | Status: AC
  Administered 2020-07-05: 4 mg via ORAL
  Filled 2020-07-05: qty 1

## 2020-07-05 MED ORDER — HYDROMORPHONE HCL 1 MG/ML IJ SOLN
1.0000 mg | Freq: Once | INTRAMUSCULAR | Status: AC
  Administered 2020-07-05: 1 mg via INTRAVENOUS
  Filled 2020-07-05: qty 1

## 2020-07-05 MED ORDER — METOCLOPRAMIDE HCL 5 MG/ML IJ SOLN
10.0000 mg | Freq: Once | INTRAMUSCULAR | Status: AC
  Administered 2020-07-05: 10 mg via INTRAVENOUS
  Filled 2020-07-05: qty 2

## 2020-07-05 MED ORDER — OXYCODONE-ACETAMINOPHEN 5-325 MG PO TABS
1.0000 | ORAL_TABLET | ORAL | Status: DC | PRN
  Administered 2020-07-05: 1 via ORAL
  Filled 2020-07-05: qty 1

## 2020-07-05 NOTE — Discharge Instructions (Signed)
You have been evaluated for your left flank pain and blood in your urine.  Fortunately a renal ultrasound performed today did not show any significant blockage of your ureter.  Your pain may be due to small kidney stones that have passed.  Call and follow-up closely with your urologist for further managements of your condition.  Drink plenty of fluid.  Return if you have any concern.

## 2020-07-05 NOTE — ED Provider Notes (Signed)
[facility] EMERGENCY DEPARTMENT Provider Note   CSN: [id number] Arrival date & time: 07/05/20  0840     History Chief Complaint  Patient presents with  . Flank Pain    Kayla Cabrera is a 39 y.o. female.  The history is provided by the patient and medical records. No language interpreter was used.  Flank Pain     39 year old female significant history of recurrent kidney stones presenting for evaluation of flank pain.  Patient report acute onset of left flank pain since last night.  Pain is sharp, shooting, waxing waning, moderate to severe, with associated nausea.  Pain now travels down to left lower abdomen.  Pain feels similar to prior kidney stones.  She also noticed some blood in the urine and some urge to urinate.  She does not complain of any fever, chest pain, shortness of breath, vaginal bleeding or vaginal discharge.  She did try taking Zofran at home and also did receive pain medication and antinausea medication while she is in the waiting room.  Currently rates the pain as 7 out of 10.  Past Medical History:  Diagnosis Date  . Kidney stone   . Kidney stones   . Obesity     There are no problems to display for this patient.   No past surgical history on file.   OB History   No obstetric history on file.     No family history on file.  Social History   Tobacco Use  . Smoking status: Never Smoker  . Smokeless tobacco: Never Used  Substance Use Topics  . Alcohol use: Yes    Comment: occ  . Drug use: No    Home Medications Prior to Admission medications   Medication Sig Start Date End Date Taking? Authorizing Provider  ibuprofen (ADVIL,MOTRIN) 800 MG tablet Take 1 tablet (800 mg total) by mouth every 8 (eight) hours as needed. Patient not taking: Reported on 04/25/2020 08/16/18   Long, Arlyss Repress, MD  methocarbamol (ROBAXIN) 500 MG tablet Take 1 tablet (500 mg total) by mouth 2 (two) times daily as needed (muscle soreness). Patient not  taking: Reported on 08/16/2018 12/25/17   Ward, Chase Picket, PA-C  naproxen (NAPROSYN) 500 MG tablet Take 1 tablet (500 mg total) by mouth 2 (two) times daily as needed. Patient not taking: Reported on 08/16/2018 12/25/17   Ward, Chase Picket, PA-C  ondansetron (ZOFRAN ODT) 4 MG disintegrating tablet Take 1 tablet (4 mg total) by mouth every 8 (eight) hours as needed for nausea or vomiting. Patient not taking: Reported on 04/25/2020 08/16/18   Long, Arlyss Repress, MD  oxyCODONE-acetaminophen (PERCOCET/ROXICET) 5-325 MG tablet Take 1 tablet by mouth every 6 (six) hours as needed for severe pain. Patient not taking: Reported on 04/25/2020 08/16/18   Maia Plan, MD  pantoprazole (PROTONIX) 40 MG tablet Take 40 mg by mouth daily. 01/30/20   [provider]    Allergies    Nsaids  Review of Systems   Review of Systems  Genitourinary: Positive for flank pain.  All other systems reviewed and are negative.   Physical Exam Updated Vital Signs BP (!) 145/107   Pulse (!) 116   Temp 98 F (36.7 C) (Oral)   Resp 18   Ht 5\' 10"  (1.778 m)   Wt 127 kg   SpO2 100%   BMI 40.18 kg/m   Physical Exam Vitals and nursing note reviewed.  Constitutional:      General: She is not in  acute distress.    Appearance: She is well-developed. She is obese.  HENT:     Head: Atraumatic.  Eyes:     Conjunctiva/sclera: Conjunctivae normal.  Cardiovascular:     Rate and Rhythm: Normal rate and regular rhythm.     Pulses: Normal pulses.     Heart sounds: Normal heart sounds.  Pulmonary:     Effort: Pulmonary effort is normal.     Breath sounds: Normal breath sounds.  Abdominal:     Palpations: Abdomen is soft.     Tenderness: There is no abdominal tenderness. There is left CVA tenderness. There is no right CVA tenderness.  Musculoskeletal:     Cervical back: Neck supple.  Skin:    Findings: No rash.  Neurological:     Mental Status: She is alert and oriented to person, place, and time.     ED  Results / Procedures / Treatments   Labs (all labs ordered are listed, but only abnormal results are displayed) Labs Reviewed  URINALYSIS, ROUTINE W REFLEX MICROSCOPIC - Abnormal; Notable for the following components:      Result Value   APPearance HAZY (*)    Hgb urine dipstick LARGE (*)    RBC / HPF >50 (*)    All other components within normal limits  BASIC METABOLIC PANEL - Abnormal; Notable for the following components:   CO2 20 (*)    Glucose, Bld 100 (*)    All other components within normal limits  CBC - Abnormal; Notable for the following components:   Hemoglobin 11.5 (*)    MCV 79.2 (*)    MCH 23.2 (*)    MCHC 29.3 (*)    Platelets 441 (*)    All other components within normal limits  I-STAT BETA HCG BLOOD, ED ([facility], [facility], [facility] ONLY)    EKG None  Radiology US Renal  Result Date: 07/05/2020 CLINICAL DATA:  Left flank pain for 1 day. EXAM: RENAL / URINARY TRACT ULTRASOUND COMPLETE COMPARISON:  CT abdomen and pelvis 04/25/2020. FINDINGS: Right Kidney: Renal measurements: 12.9 x 4.3 x 5.8 cm = volume: 166.1 mL. Echogenicity within normal limits. No mass or hydronephrosis visualized. Punctate stone seen on prior CT is not visible on this exam. Left Kidney: Renal measurements: 12.7 x 5.2 x 5.1 cm = volume: 176.5 mL. Echogenicity within normal limits. No mass or hydronephrosis visualized. Punctate stone seen on prior CT not visible on this exam. Bladder: Appears normal for degree of bladder distention. Other: None. IMPRESSION: Normal examination. Electronically Signed   By: Drusilla Kanner M.D.   On: 07/05/2020 11:36    Procedures Procedures (including critical care time)  Medications Ordered in ED Medications  oxyCODONE-acetaminophen (PERCOCET/ROXICET) 5-325 MG per tablet 1 tablet (1 tablet Oral Given 07/05/20 0849)  ondansetron (ZOFRAN-ODT) disintegrating tablet 4 mg (4 mg Oral Given 07/05/20 0849)  HYDROmorphone (DILAUDID) injection 1 mg (1 mg Intravenous Given 07/05/20  1006)  metoCLOPramide (REGLAN) injection 10 mg (10 mg Intravenous Given 07/05/20 1030)    ED Course  I have reviewed the triage vital signs and the nursing notes.  Pertinent labs & imaging results that were available during my care of the patient were reviewed by me and considered in my medical decision making (see chart for details).    MDM Rules/Calculators/A&P                          BP (!) 145/107   Pulse (!) 116  Temp 98 F (36.7 C) (Oral)   Resp 18   Ht 5\' 10"  (1.778 m)   Wt 127 kg   SpO2 100%   BMI 40.18 kg/m   Final Clinical Impression(s) / ED Diagnoses Final diagnoses:  Left flank pain  Gross hematuria    Rx / DC Orders ED Discharge Orders    None     9:59 AM Patient with history of recurrent kidney stones here with left leg pain and blood in the urine since last night that felt similar to prior kidney stone.  UA did shows evidence of blood in the urine.  Labs otherwise reassuring.  Will obtain renal stone studyVia ultrasound as patient has had several CT scans in the past.  11:56 AM Ultrasound renal did not show any concerning finding.  In the setting of no UTI, recurrent gross hematuria and at this time pain is well controlled, anticipate discharging patient home to follow-up with urology as needed.   , PA-C 07/05/20 1205    07/07/20, MD 07/05/20 [phone]

## 2020-07-05 NOTE — ED Triage Notes (Signed)
Pt arrives to ED with c/c of left flank pain, history of kidney stones and states this feels similar. Pain has made her nauseous and  With vomiting.

## 2020-08-12 ENCOUNTER — Emergency Department (HOSPITAL_COMMUNITY)
Admission: EM | Admit: 2020-08-12 | Discharge: 2020-08-12 | Disposition: A | Discharge status: Home / Self Care | Payer: BLUE CROSS/BLUE SHIELD | Attending: Emergency Medicine | Admitting: Emergency Medicine

## 2020-08-12 ENCOUNTER — Emergency Department (HOSPITAL_COMMUNITY): Payer: BLUE CROSS/BLUE SHIELD

## 2020-08-12 ENCOUNTER — Other Ambulatory Visit: Payer: Self-pay

## 2020-08-12 ENCOUNTER — Encounter (HOSPITAL_COMMUNITY): Payer: Self-pay | Admitting: Emergency Medicine

## 2020-08-12 DIAGNOSIS — Z87442 Personal history of urinary calculi: Secondary | ICD-10-CM | POA: Diagnosis not present

## 2020-08-12 DIAGNOSIS — R1032 Left lower quadrant pain: Secondary | ICD-10-CM | POA: Diagnosis present

## 2020-08-12 DIAGNOSIS — R319 Hematuria, unspecified: Secondary | ICD-10-CM | POA: Insufficient documentation

## 2020-08-12 DIAGNOSIS — R102 Pelvic and perineal pain: Secondary | ICD-10-CM | POA: Insufficient documentation

## 2020-08-12 DIAGNOSIS — R112 Nausea with vomiting, unspecified: Secondary | ICD-10-CM | POA: Insufficient documentation

## 2020-08-12 LAB — URINALYSIS, ROUTINE W REFLEX MICROSCOPIC
Bacteria, UA: NONE SEEN
Bilirubin Urine: NEGATIVE
Glucose, UA: NEGATIVE mg/dL
Ketones, ur: NEGATIVE mg/dL
Leukocytes,Ua: NEGATIVE
Nitrite: NEGATIVE
Protein, ur: NEGATIVE mg/dL
RBC / HPF: >50 RBC/hpf — ABNORMAL HIGH (ref 0–5)
Specific Gravity, Urine: 1.014 (ref 1.005–1.030)
pH: 5 (ref 5.0–8.0)

## 2020-08-12 LAB — COMPREHENSIVE METABOLIC PANEL
ALT: 20 U/L (ref 0–44)
AST: 19 U/L (ref 15–41)
Albumin: 3.5 g/dL (ref 3.5–5.0)
Alkaline Phosphatase: 57 U/L (ref 38–126)
Anion gap: 8 (ref 5–15)
BUN: 8 mg/dL (ref 6–20)
CO2: 24 mmol/L (ref 22–32)
Calcium: 8.9 mg/dL (ref 8.9–10.3)
Chloride: 108 mmol/L (ref 98–111)
Creatinine, Ser: 0.71 mg/dL (ref 0.44–1.00)
GFR, Estimated: >60 mL/min (ref >60)
Glucose, Bld: 103 mg/dL — ABNORMAL HIGH (ref 70–99)
Potassium: 4.3 mmol/L (ref 3.5–5.1)
Sodium: 140 mmol/L (ref 135–145)
Total Bilirubin: 0.4 mg/dL (ref 0.3–1.2)
Total Protein: 6.8 g/dL (ref 6.5–8.1)

## 2020-08-12 LAB — CBC
HCT: 39.3 % (ref 36.0–46.0)
Hemoglobin: 11.6 g/dL — ABNORMAL LOW (ref 12.0–15.0)
MCH: 23.8 pg — ABNORMAL LOW (ref 26.0–34.0)
MCHC: 29.5 g/dL — ABNORMAL LOW (ref 30.0–36.0)
MCV: 80.5 fL (ref 80.0–100.0)
Platelets: 369 10*3/uL (ref 150–400)
RBC: 4.88 MIL/uL (ref 3.87–5.11)
RDW: 16.4 % — ABNORMAL HIGH (ref 11.5–15.5)
WBC: 6.9 10*3/uL (ref 4.0–10.5)
nRBC: 0 % (ref 0.0–0.2)

## 2020-08-12 LAB — I-STAT BETA HCG BLOOD, ED (MC, WL, AP ONLY): I-stat hCG, quantitative: <5 m[IU]/mL (ref <5)

## 2020-08-12 LAB — LIPASE, BLOOD: Lipase: 34 U/L (ref 11–51)

## 2020-08-12 MED ORDER — HYDROCODONE-ACETAMINOPHEN 5-325 MG PO TABS
2.0000 | ORAL_TABLET | ORAL | 0 refills | Status: DC | PRN
Start: 2020-08-12 — End: 2020-10-24

## 2020-08-12 MED ORDER — FENTANYL CITRATE (PF) 100 MCG/2ML IJ SOLN
50.0000 ug | Freq: Once | INTRAMUSCULAR | Status: AC
  Administered 2020-08-12: 50 ug via INTRAVENOUS
  Filled 2020-08-12: qty 2

## 2020-08-12 MED ORDER — HYDROMORPHONE HCL 1 MG/ML IJ SOLN: 1.0000 mg | INTRAMUSCULAR | Status: DC | PRN

## 2020-08-12 MED ORDER — ONDANSETRON HCL 4 MG/2ML IJ SOLN
4.0000 mg | Freq: Once | INTRAMUSCULAR | Status: AC
  Administered 2020-08-12: 4 mg via INTRAVENOUS
  Filled 2020-08-12: qty 2

## 2020-08-12 NOTE — ED Triage Notes (Signed)
Pt. Stated, I was helping my sister move and all of sudden I started having this sharpe pain in my lower stomach and then throwed up. I did also notice there was blood in my urine.

## 2020-08-12 NOTE — ED Provider Notes (Signed)
[facility] EMERGENCY DEPARTMENT Provider Note   CSN: [id number] Arrival date & time: 08/12/20  4696     History Chief Complaint  Patient presents with  . Abdominal Pain  . Emesis  . Hematuria    Kayla Cabrera is a 39 y.o. female.  HPI   This patient is a 39 year old female with history of multiple kidney stones in the past, she has required retrieval of one of the stones with a stent, she also has a history of a prior ovarian cyst, she states that she was in her usual state of health helping a friend move this morning when she felt acute onset of sharp and severe left lower quadrant pain, no radiation, associated nausea and vomiting.  She did have some hematuria.  No back pain, denies being pregnant, denies ever being pregnant, denies any other abdominal surgery.  Symptoms are waxing and waning in intensity.  Past Medical History:  Diagnosis Date  . Kidney stone   . Kidney stones   . Obesity     There are no problems to display for this patient.   History reviewed. No pertinent surgical history.   OB History   No obstetric history on file.     No family history on file.  Social History   Tobacco Use  . Smoking status: Never Smoker  . Smokeless tobacco: Never Used  Substance Use Topics  . Alcohol use: Yes    Comment: occ  . Drug use: No    Home Medications Prior to Admission medications   Medication Sig Start Date End Date Taking? Authorizing Provider  ferrous sulfate 325 (65 FE) MG tablet Take 325 mg by mouth daily. 06/27/20  Yes [provider]  ondansetron (ZOFRAN) 4 MG tablet Take 4 mg by mouth as needed for nausea or vomiting.  07/29/20  Yes [provider]  pantoprazole (PROTONIX) 40 MG tablet Take 40 mg by mouth daily. 01/30/20  Yes [provider]  tamsulosin (FLOMAX) 0.4 MG CAPS capsule Take 0.4 mg by mouth as needed (for kidney stones).  07/29/20  Yes [provider]  zolpidem (AMBIEN) 10 MG  tablet Take 10 mg by mouth at bedtime as needed for sleep (only when traveling).  07/30/20  Yes [provider]  HYDROcodone-acetaminophen (NORCO/VICODIN) 5-325 MG tablet Take 2 tablets by mouth every 4 (four) hours as needed. 08/12/20   Kayla Hong, MD    Allergies    Nsaids  Review of Systems   Review of Systems  All other systems reviewed and are negative.   Physical Exam Updated Vital Signs BP (!) 145/95   Pulse 83   Temp 98 F (36.7 C) (Oral)   Resp 18   SpO2 100%   Physical Exam Vitals and nursing note reviewed.  Constitutional:      General: She is not in acute distress.    Appearance: She is well-developed.  HENT:     Head: Normocephalic and atraumatic.     Mouth/Throat:     Pharynx: No oropharyngeal exudate.  Eyes:     General: No scleral icterus.       Right eye: No discharge.        Left eye: No discharge.     Conjunctiva/sclera: Conjunctivae normal.     Pupils: Pupils are equal, round, and reactive to light.  Neck:     Thyroid: No thyromegaly.     Vascular: No JVD.  Cardiovascular:     Rate and Rhythm: Normal rate and  regular rhythm.     Heart sounds: Normal heart sounds. No murmur heard.  No friction rub. No gallop.   Pulmonary:     Effort: Pulmonary effort is normal. No respiratory distress.     Breath sounds: Normal breath sounds. No wheezing or rales.  Abdominal:     General: Bowel sounds are normal. There is no distension.     Palpations: Abdomen is soft. There is no mass.     Tenderness: There is abdominal tenderness.     Comments: Minimal left lower quadrant tenderness, no guarding  Musculoskeletal:        General: No tenderness. Normal range of motion.     Cervical back: Normal range of motion and neck supple.  Lymphadenopathy:     Cervical: No cervical adenopathy.  Skin:    General: Skin is warm and dry.     Findings: No erythema or rash.  Neurological:     Mental Status: She is alert.     Coordination: Coordination normal.    Psychiatric:        Behavior: Behavior normal.     ED Results / Procedures / Treatments   Labs (all labs ordered are listed, but only abnormal results are displayed) Labs Reviewed  COMPREHENSIVE METABOLIC PANEL - Abnormal; Notable for the following components:      Result Value   Glucose, Bld 103 (*)    All other components within normal limits  CBC - Abnormal; Notable for the following components:   Hemoglobin 11.6 (*)    MCH 23.8 (*)    MCHC 29.5 (*)    RDW 16.4 (*)    All other components within normal limits  URINALYSIS, ROUTINE W REFLEX MICROSCOPIC - Abnormal; Notable for the following components:   APPearance HAZY (*)    Hgb urine dipstick LARGE (*)    RBC / HPF >50 (*)    All other components within normal limits  LIPASE, BLOOD  I-STAT BETA HCG BLOOD, ED ([facility], [facility], [facility] ONLY)    EKG None  Radiology CT Renal Stone Study  Result Date: 08/12/2020 CLINICAL DATA:  Flank pain, kidney stone suspected. EXAM: CT ABDOMEN AND PELVIS WITHOUT CONTRAST TECHNIQUE: Multidetector CT imaging of the abdomen and pelvis was performed following the standard protocol without IV contrast. COMPARISON:  None. FINDINGS: Lower chest: Lung bases are clear. Hepatobiliary: No focal hepatic lesion. No biliary duct dilatation. Common bile duct is normal. Pancreas: Pancreas is normal. No ductal dilatation. No pancreatic inflammation. Spleen: Normal spleen Adrenals/urinary tract: Adrenal glands normal. Tiny nonobstructing calculus in the mid RIGHT kidney measures 2 mm. Punctate 1 mm calculus in the upper pole of the LEFT kidney. 1 mm calculus in the mid LEFT kidney. No ureterolithiasis or obstructive uropathy. No bladder calculi. Stomach/Bowel: Stomach, small bowel, appendix, and cecum are normal. The colon and rectosigmoid colon are normal. Vascular/Lymphatic: Abdominal aorta is normal caliber. No periportal or retroperitoneal adenopathy. No pelvic adenopathy. Reproductive: Uterus and adnexa unremarkable.  Other: No free fluid. Musculoskeletal: No aggressive osseous lesion. IMPRESSION: 1. Bilateral punctate nonobstructing renal calculi. 2. No ureterolithiasis or obstructive uropathy.  No bladder calculi. 3. Normal appendix. Electronically Signed   By: Genevive Bi M.D.   On: 08/12/2020 14:10    Procedures Procedures (including critical care time)  Medications Ordered in ED Medications  HYDROmorphone (DILAUDID) injection 1 mg (has no administration in time range)  fentaNYL (SUBLIMAZE) injection 50 mcg (50 mcg Intravenous Given 08/12/20 1053)  ondansetron (ZOFRAN) injection 4 mg (4 mg Intravenous Given  08/12/20 1103)    ED Course  I have reviewed the triage vital signs and the nursing notes.  Pertinent labs & imaging results that were available during my care of the patient were reviewed by me and considered in my medical decision making (see chart for details).    MDM Rules/Calculators/A&P                          Rule out pregnancy, rule out UTI, likely going to be kidney stone but could be ovarian in source, will obtain CT scan to be sure, patient agreeable.  Unfortunately she has anaphylaxis to anti-inflammatory medications, other pain medication ordered  CT shows no acute findings I discussed with pt and suggested US transvag to r/o torsion - she declines - stating she has never had intercourse - wants to f/u with GYN, d/w radiology - Dr. Amil Amen is confident that if there was a torsion the ovary would b eswollen and he doesn't think it is - pt desires to go home and f/u with GYN, given short course of pain meds  Agreeable to return if worsen.  Labs without other etiology.  Final Clinical Impression(s) / ED Diagnoses Final diagnoses:  Pelvic pain  Hematuria, unspecified type    Rx / DC Orders ED Discharge Orders         Ordered    HYDROcodone-acetaminophen (NORCO/VICODIN) 5-325 MG tablet  Every 4 hours PRN        08/12/20 1434           Kayla Hong, MD 08/12/20  1436

## 2020-08-12 NOTE — Discharge Instructions (Addendum)
Your CT scan does not show any signs of active kidney stone or any other problem - We don't have an exact cause of your pain - the CT scan didn't show a cause - I offered you an ultrasound but you have declined and would rather follow up with your Gynecologist - please make sure that you see them early this week - but come back to the ER for any severe or worsening symptoms.

## 2020-08-12 NOTE — ED Notes (Signed)
Patient verbalizes understanding of discharge instructions. Opportunity for questioning and answers were provided. Armband removed by staff, pt discharged from ED and ambulated to lobby to return home.   

## 2020-10-23 ENCOUNTER — Emergency Department (HOSPITAL_COMMUNITY)
Admission: EM | Admit: 2020-10-23 | Discharge: 2020-10-24 | Disposition: A | Discharge status: Home / Self Care | Payer: BLUE CROSS/BLUE SHIELD | Attending: Emergency Medicine | Admitting: Emergency Medicine

## 2020-10-23 ENCOUNTER — Encounter (HOSPITAL_COMMUNITY): Payer: Self-pay | Admitting: Emergency Medicine

## 2020-10-23 ENCOUNTER — Emergency Department (HOSPITAL_COMMUNITY): Payer: BLUE CROSS/BLUE SHIELD

## 2020-10-23 ENCOUNTER — Other Ambulatory Visit: Payer: Self-pay

## 2020-10-23 DIAGNOSIS — S8391XA Sprain of unspecified site of right knee, initial encounter: Secondary | ICD-10-CM | POA: Diagnosis not present

## 2020-10-23 DIAGNOSIS — X509XXA Other and unspecified overexertion or strenuous movements or postures, initial encounter: Secondary | ICD-10-CM | POA: Diagnosis not present

## 2020-10-23 DIAGNOSIS — Y9301 Activity, walking, marching and hiking: Secondary | ICD-10-CM | POA: Insufficient documentation

## 2020-10-23 DIAGNOSIS — S8991XA Unspecified injury of right lower leg, initial encounter: Secondary | ICD-10-CM | POA: Diagnosis present

## 2020-10-23 NOTE — ED Triage Notes (Signed)
Pt arrives to ED with c/o of injury to right knee while walking down the stairs today. Pt states that her knee locked up and now it is painful to bend. Hx of surgery on same knee.

## 2020-10-24 MED ORDER — HYDROCODONE-ACETAMINOPHEN 5-325 MG PO TABS
1.0000 | ORAL_TABLET | Freq: Once | ORAL | Status: AC
  Administered 2020-10-24: 1 via ORAL
  Filled 2020-10-24: qty 1

## 2020-10-24 MED ORDER — HYDROCODONE-ACETAMINOPHEN 5-325 MG PO TABS: 1.0000 | ORAL_TABLET | ORAL | 0 refills | Status: AC | PRN

## 2020-10-24 NOTE — ED Provider Notes (Signed)
[facility] [place] EMERGENCY DEPARTMENT Provider Note   CSN: [id number] Arrival date & time: 10/23/20  1527     History Chief Complaint  Patient presents with  . Knee Injury    Kayla Cabrera is a 40 y.o. female.  HPI 40 year old female presents with right knee injury.  Recently she had a knee arthroscopy by Dr. Antony Madura.  She states that she is had soreness and pain since but yesterday at around 3 PM she was walking down the stairs and when she got to the bottom she felt like her knee locked up.  Has had pain and swelling since.  No numbness.  Cannot bend her knee.  Has not take anything since the injury.  Past Medical History:  Diagnosis Date  . Kidney stone   . Kidney stones   . Obesity     There are no problems to display for this patient.   History reviewed. No pertinent surgical history.   OB History   No obstetric history on file.     History reviewed. No pertinent family history.  Social History   Tobacco Use  . Smoking status: Never Smoker  . Smokeless tobacco: Never Used  Substance Use Topics  . Alcohol use: Yes    Comment: occ  . Drug use: No    Home Medications Prior to Admission medications   Medication Sig Start Date End Date Taking? Authorizing Provider  HYDROcodone-acetaminophen (NORCO) 5-325 MG tablet Take 1 tablet by mouth every 4 (four) hours as needed for severe pain. 10/24/20  Yes Pricilla Loveless, MD  ferrous sulfate 325 (65 FE) MG tablet Take 325 mg by mouth daily. 06/27/20   [provider]  ondansetron (ZOFRAN) 4 MG tablet Take 4 mg by mouth as needed for nausea or vomiting.  07/29/20   [provider]  pantoprazole (PROTONIX) 40 MG tablet Take 40 mg by mouth daily. 01/30/20   [provider]  tamsulosin (FLOMAX) 0.4 MG CAPS capsule Take 0.4 mg by mouth as needed (for kidney stones).  07/29/20   [provider]  zolpidem (AMBIEN) 10 MG tablet Take 10 mg by mouth at bedtime as needed for sleep (only  when traveling).  07/30/20   [provider]    Allergies    Nsaids  Review of Systems   Review of Systems  Musculoskeletal: Positive for arthralgias and joint swelling.  Neurological: Negative for weakness and numbness.    Physical Exam Updated Vital Signs BP (!) 148/96 (BP Location: Right Arm)   Pulse 87   Temp 97.8 F (36.6 C) (Oral)   Resp (!) 22   LMP 10/11/2020 (Approximate)   SpO2 100%   Physical Exam Vitals and nursing note reviewed.  Constitutional:      Appearance: She is well-developed and well-nourished. She is obese.  HENT:     Head: Normocephalic and atraumatic.     Right Ear: External ear normal.     Left Ear: External ear normal.     Nose: Nose normal.  Eyes:     General:        Right eye: No discharge.        Left eye: No discharge.  Cardiovascular:     Rate and Rhythm: Normal rate and regular rhythm.  Pulmonary:     Effort: Pulmonary effort is normal.  Abdominal:     General: There is no distension.  Musculoskeletal:     Right knee: Decreased range of motion. Tenderness present over the medial joint line.  Comments: Right knee has no obvious swelling, erythema or increased warmth. Tenderness especially in medial knee  Skin:    General: Skin is warm and dry.     Findings: No erythema.  Neurological:     Mental Status: She is alert.  Psychiatric:        Mood and Affect: Mood is not anxious.     ED Results / Procedures / Treatments   Labs (all labs ordered are listed, but only abnormal results are displayed) Labs Reviewed - No data to display  EKG None  Radiology DG Knee 2 Views Right  Result Date: 10/23/2020 CLINICAL DATA:  Right knee pain. EXAM: RIGHT KNEE - 1-2 VIEW COMPARISON:  None. FINDINGS: No evidence of fracture, dislocation, or joint effusion. Mild tricompartmental peripheral spurring with preservation of joint spaces. Minimal spurring of the tibial spines. No erosion or bony destruction. Soft tissues are  unremarkable. IMPRESSION: 1. No acute abnormality. 2. Mild tricompartmental peripheral spurring suggesting early degenerative change. Electronically Signed   By: Narda Rutherford M.D.   On: 10/23/2020 17:36    Procedures Procedures   Medications Ordered in ED Medications  HYDROcodone-acetaminophen (NORCO/VICODIN) 5-325 MG per tablet 1 tablet (has no administration in time range)    ED Course  I have reviewed the triage vital signs and the nursing notes.  Pertinent labs & imaging results that were available during my care of the patient were reviewed by me and considered in my medical decision making (see chart for details).    MDM Rules/Calculators/A&P                          Patient presents with what sounds like a knee sprain.  She has a hard time bending her knee.  I do not appreciate any obvious effusion and no effusion on x-ray.  I personally reviewed the knee x-ray images.  At this point, will give short course of Norco for breakthrough pain, recommend Tylenol, and give crutches and knee immobilizer.  Follow-up with her orthopedist.  Highly doubt acute joint infection. Final Clinical Impression(s) / ED Diagnoses Final diagnoses:  Sprain of right knee, unspecified ligament, initial encounter    Rx / DC Orders ED Discharge Orders         Ordered    HYDROcodone-acetaminophen (NORCO) 5-325 MG tablet  Every 4 hours PRN        10/24/20 0756           Pricilla Loveless, MD 10/24/20 [id number]

## 2020-10-24 NOTE — Progress Notes (Signed)
Orthopedic Tech Progress Note Patient Details:  Kayla Cabrera 19-Mar-1981 [id number]  Ortho Devices Type of Ortho Device: Knee Immobilizer,Crutches Ortho Device/Splint Location: RLE Ortho Device/Splint Interventions: Ordered,Application,Adjustment   Post Interventions Patient Tolerated: Well,Ambulated well Instructions Provided: Poper ambulation with device,Care of device   Donald Pore 10/24/2020, 8:15 AM
